# Patient Record
Sex: Male | Born: 1950 | Race: White | Hispanic: No | Marital: Married | Smoking: Former smoker
Health system: Southern US, Community
[De-identification: ages and names within clinical notes are randomized; demographics above are authoritative.]

## PROBLEM LIST (undated history)

## (undated) DIAGNOSIS — I2510 Atherosclerotic heart disease of native coronary artery without angina pectoris: Secondary | ICD-10-CM

## (undated) DIAGNOSIS — E785 Hyperlipidemia, unspecified: Secondary | ICD-10-CM

## (undated) DIAGNOSIS — E119 Type 2 diabetes mellitus without complications: Secondary | ICD-10-CM

## (undated) DIAGNOSIS — C61 Malignant neoplasm of prostate: Secondary | ICD-10-CM

## (undated) DIAGNOSIS — I10 Essential (primary) hypertension: Secondary | ICD-10-CM

## (undated) HISTORY — DX: Hyperlipidemia, unspecified: E78.5

## (undated) HISTORY — DX: Essential (primary) hypertension: I10

## (undated) HISTORY — DX: Atherosclerotic heart disease of native coronary artery without angina pectoris: I25.10

---

## 2010-04-13 ENCOUNTER — Other Ambulatory Visit: Payer: Self-pay | Admitting: Urology

## 2010-04-13 ENCOUNTER — Encounter (HOSPITAL_COMMUNITY): Payer: BC Managed Care – PPO

## 2010-04-13 ENCOUNTER — Ambulatory Visit (HOSPITAL_COMMUNITY)
Admission: RE | Admit: 2010-04-13 | Discharge: 2010-04-13 | Disposition: A | Discharge status: Home / Self Care | Payer: BC Managed Care – PPO | Source: Ambulatory Visit | Attending: Urology | Admitting: Urology

## 2010-04-13 DIAGNOSIS — Z01812 Encounter for preprocedural laboratory examination: Secondary | ICD-10-CM | POA: Insufficient documentation

## 2010-04-13 DIAGNOSIS — Z01818 Encounter for other preprocedural examination: Secondary | ICD-10-CM | POA: Insufficient documentation

## 2010-04-13 DIAGNOSIS — Z01810 Encounter for preprocedural cardiovascular examination: Secondary | ICD-10-CM | POA: Insufficient documentation

## 2010-04-13 LAB — SURGICAL PCR SCREEN
MRSA, PCR: NEGATIVE
Staphylococcus aureus: POSITIVE — AB

## 2010-04-13 LAB — BASIC METABOLIC PANEL
BUN: 13 mg/dL (ref 6–23)
Chloride: 103 mEq/L (ref 96–112)
GFR calc Af Amer: >60 mL/min (ref >60)
GFR calc non Af Amer: >60 mL/min (ref >60)
Potassium: 4.3 mEq/L (ref 3.5–5.1)
Sodium: 139 mEq/L (ref 135–145)

## 2010-04-13 LAB — CBC
Platelets: 144 10*3/uL — ABNORMAL LOW (ref 150–400)
RBC: 4.57 MIL/uL (ref 4.22–5.81)
RDW: 13.8 % (ref 11.5–15.5)
WBC: 5.6 10*3/uL (ref 4.0–10.5)

## 2010-04-20 ENCOUNTER — Other Ambulatory Visit: Payer: Self-pay | Admitting: Urology

## 2010-04-20 ENCOUNTER — Inpatient Hospital Stay (HOSPITAL_COMMUNITY)
Admission: RE | Admit: 2010-04-20 | Discharge: 2010-04-21 | DRG: 335 | Disposition: A | Discharge status: Home / Self Care | Payer: BC Managed Care – PPO | Source: Ambulatory Visit | Attending: Urology | Admitting: Urology

## 2010-04-20 DIAGNOSIS — E119 Type 2 diabetes mellitus without complications: Secondary | ICD-10-CM | POA: Diagnosis present

## 2010-04-20 DIAGNOSIS — I10 Essential (primary) hypertension: Secondary | ICD-10-CM | POA: Diagnosis present

## 2010-04-20 DIAGNOSIS — F17200 Nicotine dependence, unspecified, uncomplicated: Secondary | ICD-10-CM | POA: Diagnosis present

## 2010-04-20 DIAGNOSIS — C61 Malignant neoplasm of prostate: Principal | ICD-10-CM | POA: Diagnosis present

## 2010-04-20 LAB — GLUCOSE, CAPILLARY
Glucose-Capillary: 117 mg/dL — ABNORMAL HIGH (ref 70–99)
Glucose-Capillary: 184 mg/dL — ABNORMAL HIGH (ref 70–99)
Glucose-Capillary: 116 mg/dL — ABNORMAL HIGH (ref 70–99)
Glucose-Capillary: 97 mg/dL (ref 70–99)

## 2010-04-20 LAB — CBC
Hemoglobin: 14.4 g/dL (ref 13.0–17.0)
MCV: 94.7 fL (ref 78.0–100.0)
Platelets: 165 10*3/uL (ref 150–400)
RBC: 4.5 MIL/uL (ref 4.22–5.81)
WBC: 13.3 10*3/uL — ABNORMAL HIGH (ref 4.0–10.5)

## 2010-04-20 LAB — TYPE AND SCREEN
ABO/RH(D): A POS
Antibody Screen: NEGATIVE

## 2010-04-20 LAB — BASIC METABOLIC PANEL
BUN: 14 mg/dL (ref 6–23)
CO2: 26 mEq/L (ref 19–32)
Chloride: 104 mEq/L (ref 96–112)
Potassium: 4.3 mEq/L (ref 3.5–5.1)

## 2010-04-21 LAB — DIFFERENTIAL
Basophils Absolute: 0 10*3/uL (ref 0.0–0.1)
Lymphocytes Relative: 16 % (ref 12–46)
Lymphs Abs: 1.6 10*3/uL (ref 0.7–4.0)
Monocytes Absolute: 1 10*3/uL (ref 0.1–1.0)
Neutro Abs: 7.1 10*3/uL (ref 1.7–7.7)

## 2010-04-21 LAB — CBC
HCT: 39.3 % (ref 39.0–52.0)
Hemoglobin: 13 g/dL (ref 13.0–17.0)
MCV: 95.2 fL (ref 78.0–100.0)
WBC: 9.8 10*3/uL (ref 4.0–10.5)

## 2010-04-21 LAB — BASIC METABOLIC PANEL
CO2: 25 mEq/L (ref 19–32)
Glucose, Bld: 105 mg/dL — ABNORMAL HIGH (ref 70–99)
Potassium: 4 mEq/L (ref 3.5–5.1)
Sodium: 138 mEq/L (ref 135–145)

## 2010-04-29 NOTE — Op Note (Signed)
Eddie Carpenter, LAMBSON NO.:  [id number]  MEDICAL RECORD NO.:  [id number]           PATIENT TYPE:  I  LOCATION:  1424                         FACILITY:  [facility]  PHYSICIAN:  Valetta Fuller, M.D.  DATE OF BIRTH:  05-13-1950  DATE OF PROCEDURE:  04/20/2010 DATE OF DISCHARGE:  04/21/2010                              OPERATIVE REPORT   PREOPERATIVE DIAGNOSIS:  Clinical stage T1c adenocarcinoma of the prostate.  POSTOPERATIVE DIAGNOSIS:  Clinical stage T1c adenocarcinoma of the prostate.  PROCEDURE PERFORMED:  Robotic-assisted laparoscopic radical retropubic prostatectomy.  SURGEON:  Valetta Fuller, MD  FIRST ASSISTANT:  Delia Chimes, nurse practitioner.  ANESTHESIA:  General endotracheal.  INDICATIONS:  Mr. Chasen is 60 years of age.  The patient was noted to have a mildly elevated PSA of 4.1.  Ultrasound revealed approximately 50 g prostate which on biopsy showed adenocarcinoma of the prostate with aGleason score of 3 +4 equals 7, involving one of the 12 cores.  The patient underwent extensive discussion about treatment options.  He elected to proceed with radical retropubic prostatectomy and appeared to understand the advantages and disadvantages of that approach.  The patient now presents for the procedure.  He has performed a mechanical bowel prep.  He has had placement of compression boots for DVT prophylaxis and has received perioperative antibiotics.  TECHNIQUE AND FINDINGS:  The patient was brought to the operating room where he had successful induction of general endotracheal anesthesia. He was placed in a low lithotomy position with all extremities carefully padded and was secured to the operative table.  He was prepped and draped in usual manner and then placed in a straight Trendelenburg position.  Foley catheter was inserted sterilely on the field.  Initial camera port incision was chosen 18 cm above the pubic symphysis just above the  umbilicus.  A standard open Hasson technique was utilized and placement of the initial 12-mm trocar and abdominal insufflation occurred without incident.  All other trocars were placed with direct visual guidance including a 12-mm and 5-mm assistant port along with 3 mm robotic trocars.  The surgical cart was then docked.  The bladder was filled and the space of Retzius developed with electrocautery scissors and blunt dissection.  Superficial fat was taken off the endopelvic fascia and bladder neck.  The endopelvic fascia was then incised widely from base to apex.  Levator musculature was swept off the apex of the prostate isolating the dorsal venous complex which was stapled with an ETS stapling device with good hemostasis.  Anterior bladder neck was identified with the aid of the Foley balloon. This was then transected with electrocautery scissors.  Indigo carmine was given and we were well away from the ureteral orifices.  The Foley catheter was retracted anteriorly.  There was no evidence of a middle lobe.  The posterior bladder neck was transected and the dissection carried down to the vas deferens and seminal vesicles which were individually dissected free.  With the anterior traction, the posterior plane between the rectum and prostate was then established.  The patient was felt to be a candidate for bilateral nerve spare. Superficial  anterior fascia off the prostate was incised and the dissection then carried posterior laterally until groove established between the neurovascular bundle and the posterior lateral capsule of the prostate.  That was then extended from apex to base of the prostate. Lateral pedicles of the prostate were taken with an EnSeal  device. The anterior urethra was identified with a Foley catheter and transected in the midline.  The Foley catheter was then removed and the posterior urethra transected.  Rectourethralis tissue was then sharply incised and the  prostate specimen was removed.  The pelvis was irrigated and rectal insufflation revealed no evidence of rectal injury.  We did not feel that his pathology required bilateral lymph node dissection.  Attention was then turned towards reconstruction.  Again the ureteral orifices were identified.  The posterior bladder neck and posterior urethra reapproximated with a 2-0 Vicryl suture.  The anastomosis was then completed with a double-armed 3-0 Monocryl suture and a new catheter was placed.  Irrigation revealed no evidence of leakage and clear return of blue-tinged urine/irrigant.  A pelvic drain was placed through one of the robotic trocars, secured to the skin, and placed overlying the anastomosis.  The prostate specimen was placed in an Endopouch retrieval bag system.  The 12-mm assistant port site was closed with a Vicryl suture with the aid of a suture passer.  The camera port incision was extended slightly to allow for removal of the specimen and that fascia was then closed with a running Vicryl suture.  All trocars were taken out with direct visual guidance.  All trocar sites were infiltrated with Marcaine and closed with clips.  Estimated blood loss was approximately 100 cc.  The patient had no obvious complications and was brought to recovery room in stable condition.     Valetta Fuller, M.D.     DSG/MEDQ  D:  04/21/2010  T:  04/21/2010  Job:  [id number]  Electronically Signed by Barron Alvine M.D. on 04/29/2010 01:47:33 PM

## 2012-04-14 IMAGING — CR DG CHEST 2V
2 series · 2 of 2 positions shown · non-contrast
Comparison: None

CLINICAL DATA: Preop prostate surgery

CHEST - 2 VIEW

[w chest pa]
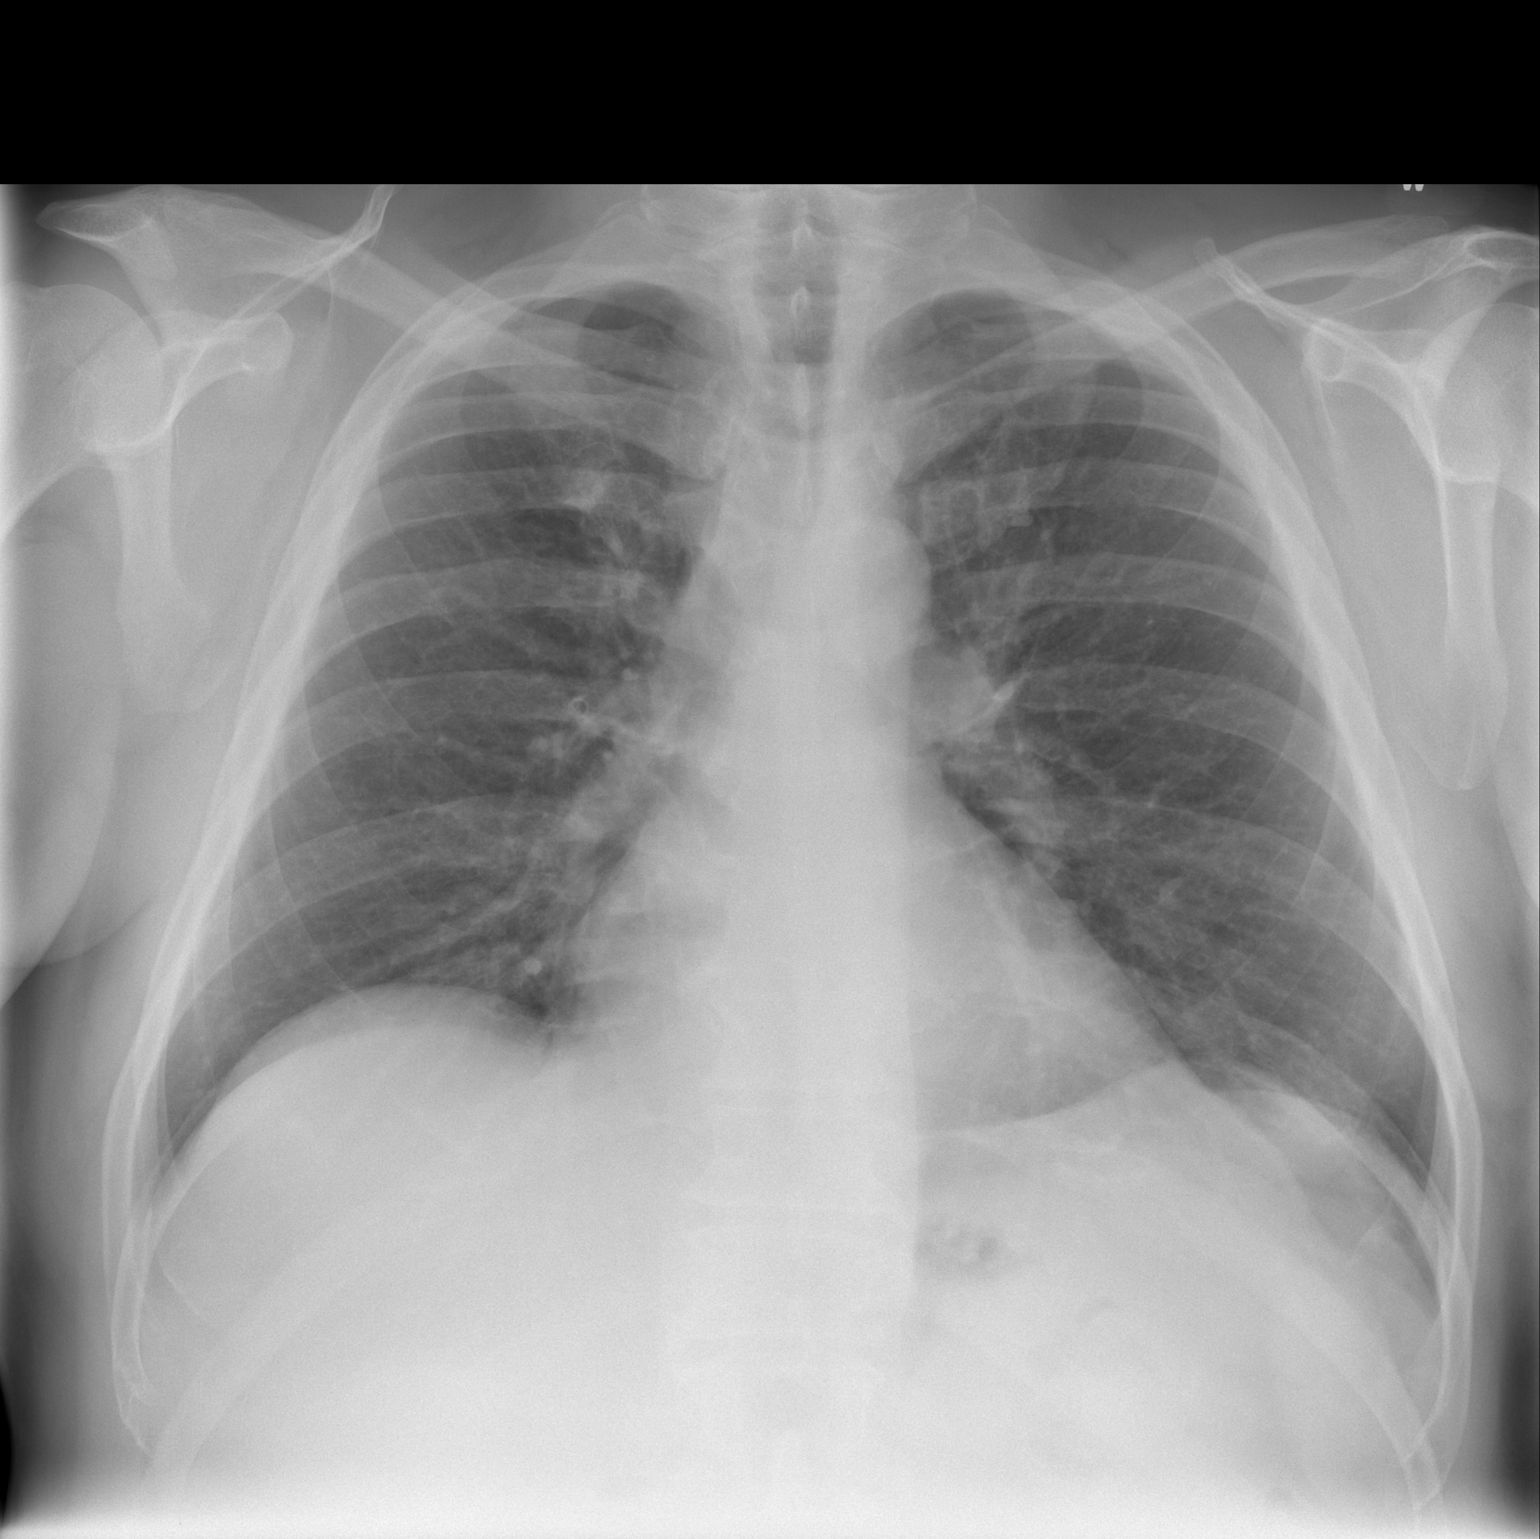

[w chest lat]
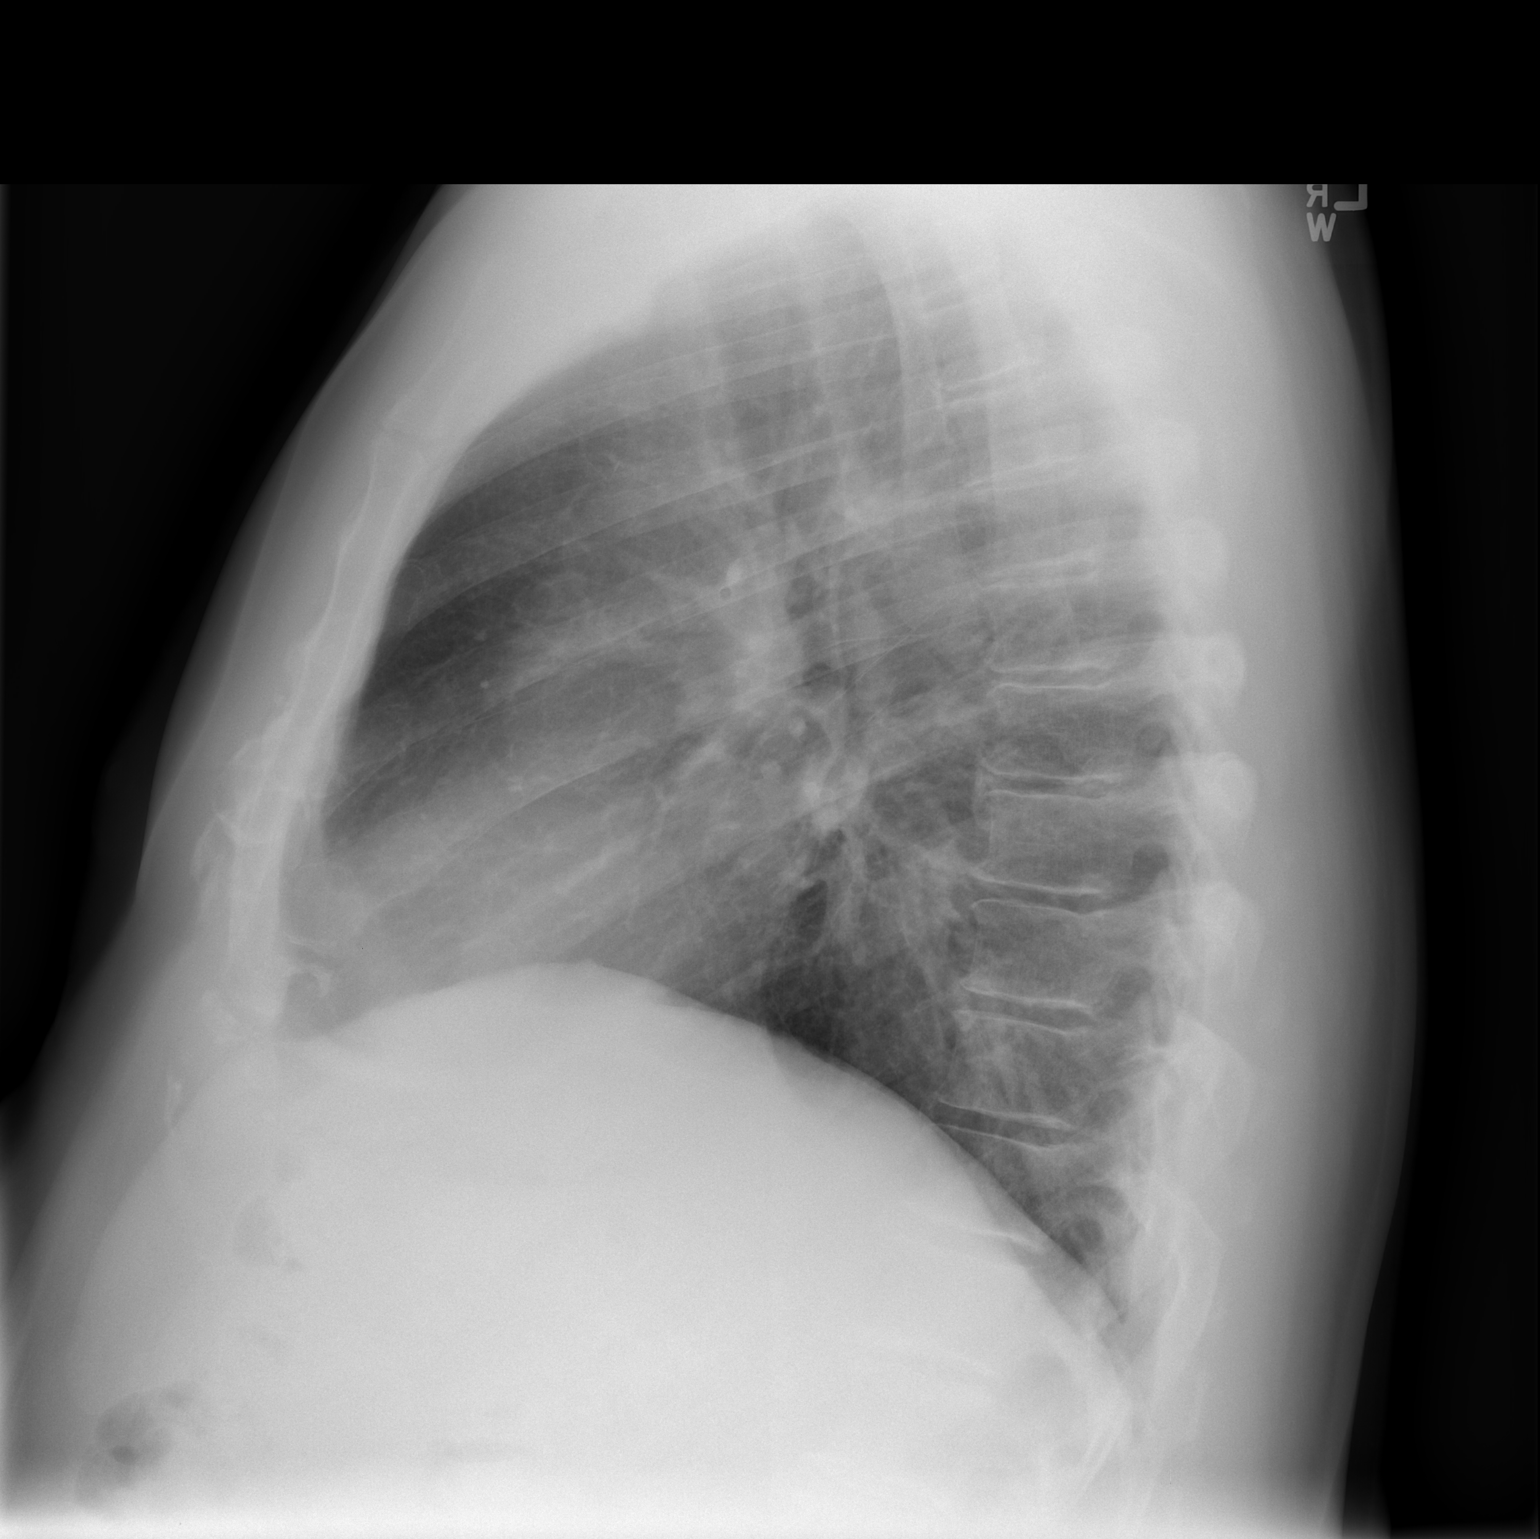

[2 of 2 positions shown; findings below may reference images not displayed]

FINDINGS: Heart size is normal.  Negative for heart failure.  The
lungs are clear without infiltrate or effusion.  Negative for mass
lesion.
IMPRESSION: No active cardiopulmonary disease.

## 2018-08-18 ENCOUNTER — Emergency Department (HOSPITAL_COMMUNITY): Payer: BC Managed Care – PPO | Admitting: Certified Registered Nurse Anesthetist

## 2018-08-18 ENCOUNTER — Encounter (HOSPITAL_COMMUNITY): Payer: Self-pay | Admitting: Emergency Medicine

## 2018-08-18 ENCOUNTER — Ambulatory Visit (HOSPITAL_COMMUNITY)
Admission: EM | Admit: 2018-08-18 | Discharge: 2018-08-18 | Disposition: A | Discharge status: Home / Self Care | Payer: BC Managed Care – PPO | Attending: Emergency Medicine | Admitting: Emergency Medicine

## 2018-08-18 ENCOUNTER — Encounter (HOSPITAL_COMMUNITY)
Admission: EM | Disposition: A | Discharge status: Home / Self Care | Payer: Self-pay | Source: Home / Self Care | Attending: Emergency Medicine

## 2018-08-18 ENCOUNTER — Other Ambulatory Visit: Payer: Self-pay

## 2018-08-18 ENCOUNTER — Emergency Department (HOSPITAL_COMMUNITY): Payer: BC Managed Care – PPO

## 2018-08-18 DIAGNOSIS — S51851A Open bite of right forearm, initial encounter: Secondary | ICD-10-CM

## 2018-08-18 DIAGNOSIS — Z87891 Personal history of nicotine dependence: Secondary | ICD-10-CM | POA: Diagnosis not present

## 2018-08-18 DIAGNOSIS — Z79899 Other long term (current) drug therapy: Secondary | ICD-10-CM | POA: Insufficient documentation

## 2018-08-18 DIAGNOSIS — W540XXA Bitten by dog, initial encounter: Secondary | ICD-10-CM

## 2018-08-18 DIAGNOSIS — Z7984 Long term (current) use of oral hypoglycemic drugs: Secondary | ICD-10-CM | POA: Insufficient documentation

## 2018-08-18 DIAGNOSIS — S41151A Open bite of right upper arm, initial encounter: Secondary | ICD-10-CM | POA: Diagnosis present

## 2018-08-18 DIAGNOSIS — Z8546 Personal history of malignant neoplasm of prostate: Secondary | ICD-10-CM | POA: Insufficient documentation

## 2018-08-18 DIAGNOSIS — S61451A Open bite of right hand, initial encounter: Secondary | ICD-10-CM | POA: Insufficient documentation

## 2018-08-18 DIAGNOSIS — Z1159 Encounter for screening for other viral diseases: Secondary | ICD-10-CM | POA: Insufficient documentation

## 2018-08-18 DIAGNOSIS — E119 Type 2 diabetes mellitus without complications: Secondary | ICD-10-CM | POA: Diagnosis not present

## 2018-08-18 HISTORY — DX: Malignant neoplasm of prostate: C61

## 2018-08-18 HISTORY — DX: Type 2 diabetes mellitus without complications: E11.9

## 2018-08-18 HISTORY — DX: Bitten by dog, initial encounter: W54.0XXA

## 2018-08-18 HISTORY — DX: Open bite of right upper arm, initial encounter: S41.151A

## 2018-08-18 HISTORY — PX: I & D EXTREMITY: SHX5045

## 2018-08-18 LAB — BASIC METABOLIC PANEL
Anion gap: 7 (ref 5–15)
BUN: 19 mg/dL (ref 8–23)
CO2: 24 mmol/L (ref 22–32)
Calcium: 9.5 mg/dL (ref 8.9–10.3)
Chloride: 106 mmol/L (ref 98–111)
Creatinine, Ser: 1.03 mg/dL (ref 0.61–1.24)
GFR calc Af Amer: >60 mL/min (ref >60)
GFR calc non Af Amer: >60 mL/min (ref >60)
Glucose, Bld: 302 mg/dL — ABNORMAL HIGH (ref 70–99)
Potassium: 4.1 mmol/L (ref 3.5–5.1)
Sodium: 137 mmol/L (ref 135–145)

## 2018-08-18 LAB — CBC WITH DIFFERENTIAL/PLATELET
Abs Immature Granulocytes: 0.04 10*3/uL (ref 0.00–0.07)
Basophils Absolute: 0 10*3/uL (ref 0.0–0.1)
Basophils Relative: 0 %
Eosinophils Absolute: 0.1 10*3/uL (ref 0.0–0.5)
Eosinophils Relative: 1 %
HCT: 42.2 % (ref 39.0–52.0)
Hemoglobin: 14.2 g/dL (ref 13.0–17.0)
Immature Granulocytes: 1 %
Lymphocytes Relative: 11 %
Lymphs Abs: 0.8 10*3/uL (ref 0.7–4.0)
MCH: 31.1 pg (ref 26.0–34.0)
MCHC: 33.6 g/dL (ref 30.0–36.0)
MCV: 92.3 fL (ref 80.0–100.0)
Monocytes Absolute: 0.5 10*3/uL (ref 0.1–1.0)
Monocytes Relative: 7 %
Neutro Abs: 5.9 10*3/uL (ref 1.7–7.7)
Neutrophils Relative %: 80 %
Platelets: 157 10*3/uL (ref 150–400)
RBC: 4.57 MIL/uL (ref 4.22–5.81)
RDW: 13.8 % (ref 11.5–15.5)
WBC: 7.3 10*3/uL (ref 4.0–10.5)
nRBC: 0 % (ref 0.0–0.2)

## 2018-08-18 LAB — SARS CORONAVIRUS 2 BY RT PCR (HOSPITAL ORDER, PERFORMED IN ~~LOC~~ HOSPITAL LAB): SARS Coronavirus 2: NEGATIVE

## 2018-08-18 SURGERY — IRRIGATION AND DEBRIDEMENT EXTREMITY
Anesthesia: General | Site: Arm Lower | Laterality: Right

## 2018-08-18 MED ORDER — ONDANSETRON HCL 4 MG/2ML IJ SOLN
INTRAMUSCULAR | Status: AC
  Filled 2018-08-18: qty 2

## 2018-08-18 MED ORDER — HYDROCODONE-ACETAMINOPHEN 5-325 MG PO TABS: 1.0000 | ORAL_TABLET | Freq: Four times a day (QID) | ORAL | 0 refills | Status: AC | PRN

## 2018-08-18 MED ORDER — TETANUS-DIPHTH-ACELL PERTUSSIS 5-2.5-18.5 LF-MCG/0.5 IM SUSP
0.5000 mL | Freq: Once | INTRAMUSCULAR | Status: AC
  Administered 2018-08-18: 14:00:00 0.5 mL via INTRAMUSCULAR
  Filled 2018-08-18: qty 0.5

## 2018-08-18 MED ORDER — LIDOCAINE 2% (20 MG/ML) 5 ML SYRINGE
INTRAMUSCULAR | Status: DC | PRN
  Administered 2018-08-18: 100 mg via INTRAVENOUS

## 2018-08-18 MED ORDER — PROPOFOL 10 MG/ML IV BOLUS
INTRAVENOUS | Status: AC
  Filled 2018-08-18: qty 20

## 2018-08-18 MED ORDER — FENTANYL CITRATE (PF) 250 MCG/5ML IJ SOLN
INTRAMUSCULAR | Status: AC
  Filled 2018-08-18: qty 5

## 2018-08-18 MED ORDER — BUPIVACAINE HCL 0.25 % IJ SOLN
INTRAMUSCULAR | Status: DC | PRN
  Administered 2018-08-18: 10 mL

## 2018-08-18 MED ORDER — ONDANSETRON HCL 4 MG PO TABS: 4.0000 mg | ORAL_TABLET | Freq: Three times a day (TID) | ORAL | 0 refills | Status: DC | PRN

## 2018-08-18 MED ORDER — LACTATED RINGERS IV SOLN
INTRAVENOUS | Status: DC | PRN
  Administered 2018-08-18: 17:00:00 via INTRAVENOUS

## 2018-08-18 MED ORDER — MIDAZOLAM HCL 2 MG/2ML IJ SOLN
INTRAMUSCULAR | Status: DC | PRN
  Administered 2018-08-18: 2 mg via INTRAVENOUS

## 2018-08-18 MED ORDER — CEFAZOLIN SODIUM-DEXTROSE 2-3 GM-%(50ML) IV SOLR
INTRAVENOUS | Status: DC | PRN
  Administered 2018-08-18: 2 g via INTRAVENOUS

## 2018-08-18 MED ORDER — PHENYLEPHRINE 40 MCG/ML (10ML) SYRINGE FOR IV PUSH (FOR BLOOD PRESSURE SUPPORT)
PREFILLED_SYRINGE | INTRAVENOUS | Status: AC
  Filled 2018-08-18: qty 10

## 2018-08-18 MED ORDER — CLINDAMYCIN HCL 300 MG PO CAPS: 300.0000 mg | ORAL_CAPSULE | Freq: Three times a day (TID) | ORAL | 0 refills | Status: AC

## 2018-08-18 MED ORDER — FENTANYL CITRATE (PF) 250 MCG/5ML IJ SOLN
INTRAMUSCULAR | Status: DC | PRN
  Administered 2018-08-18: 25 ug via INTRAVENOUS
  Administered 2018-08-18: 50 ug via INTRAVENOUS

## 2018-08-18 MED ORDER — OXYCODONE-ACETAMINOPHEN 5-325 MG PO TABS
1.0000 | ORAL_TABLET | Freq: Once | ORAL | Status: AC
  Administered 2018-08-18: 1 via ORAL
  Filled 2018-08-18: qty 1

## 2018-08-18 MED ORDER — DEXAMETHASONE SODIUM PHOSPHATE 10 MG/ML IJ SOLN
INTRAMUSCULAR | Status: DC | PRN
  Administered 2018-08-18: 5 mg via INTRAVENOUS

## 2018-08-18 MED ORDER — CLINDAMYCIN PHOSPHATE 600 MG/50ML IV SOLN
600.0000 mg | Freq: Once | INTRAVENOUS | Status: AC
  Administered 2018-08-18: 600 mg via INTRAVENOUS
  Filled 2018-08-18: qty 50

## 2018-08-18 MED ORDER — DOXYCYCLINE HYCLATE 50 MG PO CAPS: 100.0000 mg | ORAL_CAPSULE | Freq: Two times a day (BID) | ORAL | 0 refills | Status: AC

## 2018-08-18 MED ORDER — BUPIVACAINE HCL (PF) 0.25 % IJ SOLN
INTRAMUSCULAR | Status: AC
  Filled 2018-08-18: qty 30

## 2018-08-18 MED ORDER — ONDANSETRON HCL 4 MG/2ML IJ SOLN
INTRAMUSCULAR | Status: DC | PRN
  Administered 2018-08-18: 4 mg via INTRAVENOUS

## 2018-08-18 MED ORDER — SODIUM CHLORIDE 0.9 % IR SOLN
Status: DC | PRN
  Administered 2018-08-18: 3000 mL

## 2018-08-18 MED ORDER — CEFAZOLIN SODIUM-DEXTROSE 2-4 GM/100ML-% IV SOLN
INTRAVENOUS | Status: AC
  Filled 2018-08-18: qty 100

## 2018-08-18 MED ORDER — SUCCINYLCHOLINE CHLORIDE 20 MG/ML IJ SOLN
INTRAMUSCULAR | Status: DC | PRN
  Administered 2018-08-18: 160 mg via INTRAVENOUS

## 2018-08-18 MED ORDER — MIDAZOLAM HCL 2 MG/2ML IJ SOLN
INTRAMUSCULAR | Status: AC
  Filled 2018-08-18: qty 2

## 2018-08-18 MED ORDER — 0.9 % SODIUM CHLORIDE (POUR BTL) OPTIME
TOPICAL | Status: DC | PRN
  Administered 2018-08-18: 1000 mL

## 2018-08-18 MED ORDER — PROPOFOL 10 MG/ML IV BOLUS
INTRAVENOUS | Status: DC | PRN
  Administered 2018-08-18: 160 mg via INTRAVENOUS

## 2018-08-18 SURGICAL SUPPLY — 58 items
BANDAGE ACE 4X5 VEL STRL LF (GAUZE/BANDAGES/DRESSINGS) ×4 IMPLANT
BANDAGE ACE 6X5 VEL STRL LF (GAUZE/BANDAGES/DRESSINGS) ×2 IMPLANT
BANDAGE ELASTIC 4 VELCRO ST LF (GAUZE/BANDAGES/DRESSINGS) ×4 IMPLANT
BANDAGE ESMARK 6X9 LF (GAUZE/BANDAGES/DRESSINGS) IMPLANT
BLADE SURG 10 STRL SS (BLADE) ×2 IMPLANT
BNDG COHESIVE 4X5 TAN STRL (GAUZE/BANDAGES/DRESSINGS) ×2 IMPLANT
BNDG ELASTIC 6X10 VLCR STRL LF (GAUZE/BANDAGES/DRESSINGS) ×2 IMPLANT
BNDG ESMARK 4X9 LF (GAUZE/BANDAGES/DRESSINGS) IMPLANT
BNDG ESMARK 6X9 LF (GAUZE/BANDAGES/DRESSINGS)
BNDG GAUZE ELAST 4 BULKY (GAUZE/BANDAGES/DRESSINGS) ×4 IMPLANT
CONT SPEC 4OZ CLIKSEAL STRL BL (MISCELLANEOUS) IMPLANT
COVER SURGICAL LIGHT HANDLE (MISCELLANEOUS) ×2 IMPLANT
COVER WAND RF STERILE (DRAPES) ×2 IMPLANT
CUFF TOURN SGL LL 12 NO SLV (MISCELLANEOUS) ×2 IMPLANT
CUFF TOURN SGL QUICK 34 (TOURNIQUET CUFF) ×1
CUFF TRNQT CYL 34X4.125X (TOURNIQUET CUFF) ×1 IMPLANT
DRAPE SURG 17X23 STRL (DRAPES) IMPLANT
DRAPE U-SHAPE 47X51 STRL (DRAPES) ×2 IMPLANT
DRESSING ADAPTIC 1/2  N-ADH (PACKING) ×6 IMPLANT
DRSG PAD ABDOMINAL 8X10 ST (GAUZE/BANDAGES/DRESSINGS) ×2 IMPLANT
DURAPREP 26ML APPLICATOR (WOUND CARE) ×2 IMPLANT
ELECT REM PT RETURN 9FT ADLT (ELECTROSURGICAL)
ELECTRODE REM PT RTRN 9FT ADLT (ELECTROSURGICAL) IMPLANT
EVACUATOR 1/8 PVC DRAIN (DRAIN) IMPLANT
FACESHIELD WRAPAROUND (MASK) ×2 IMPLANT
GAUZE SPONGE 4X4 12PLY STRL (GAUZE/BANDAGES/DRESSINGS) ×2 IMPLANT
GAUZE SPONGE 4X4 12PLY STRL LF (GAUZE/BANDAGES/DRESSINGS) ×4 IMPLANT
GAUZE XEROFORM 1X8 LF (GAUZE/BANDAGES/DRESSINGS) IMPLANT
GLOVE BIO SURGEON STRL SZ7.5 (GLOVE) ×6 IMPLANT
GLOVE BIOGEL PI IND STRL 8 (GLOVE) ×3 IMPLANT
GLOVE BIOGEL PI INDICATOR 8 (GLOVE) ×3
GOWN STRL REUS W/ TWL LRG LVL3 (GOWN DISPOSABLE) ×3 IMPLANT
GOWN STRL REUS W/TWL LRG LVL3 (GOWN DISPOSABLE) ×3
HANDPIECE INTERPULSE COAX TIP (DISPOSABLE)
KIT BASIN OR (CUSTOM PROCEDURE TRAY) ×2 IMPLANT
KIT TURNOVER KIT B (KITS) ×2 IMPLANT
MANIFOLD NEPTUNE II (INSTRUMENTS) ×2 IMPLANT
NEEDLE 25GAX1.5 (MISCELLANEOUS) IMPLANT
NS IRRIG 1000ML POUR BTL (IV SOLUTION) ×2 IMPLANT
PACK ORTHO EXTREMITY (CUSTOM PROCEDURE TRAY) ×2 IMPLANT
PAD ARMBOARD 7.5X6 YLW CONV (MISCELLANEOUS) ×4 IMPLANT
SET CYSTO W/LG BORE CLAMP LF (SET/KITS/TRAYS/PACK) ×2 IMPLANT
SET HNDPC FAN SPRY TIP SCT (DISPOSABLE) IMPLANT
SLING ARM FOAM STRAP XLG (SOFTGOODS) ×2 IMPLANT
SPONGE LAP 18X18 RF (DISPOSABLE) ×2 IMPLANT
STOCKINETTE IMPERVIOUS 9X36 MD (GAUZE/BANDAGES/DRESSINGS) ×2 IMPLANT
SUT ETHILON 3 0 PS 1 (SUTURE) ×8 IMPLANT
SUT PDS AB 2-0 CT1 27 (SUTURE) IMPLANT
SUT VIC AB 0 CT1 27 (SUTURE) ×1
SUT VIC AB 0 CT1 27XBRD ANBCTR (SUTURE) ×1 IMPLANT
SWAB CULTURE ESWAB REG 1ML (MISCELLANEOUS) IMPLANT
SYR CONTROL 10ML LL (SYRINGE) IMPLANT
TOWEL GREEN STERILE FF (TOWEL DISPOSABLE) ×4 IMPLANT
TOWEL OR 17X26 10 PK STRL BLUE (TOWEL DISPOSABLE) ×2 IMPLANT
TUBE CONNECTING 12X1/4 (SUCTIONS) ×2 IMPLANT
TUBING CYSTO DISP (UROLOGICAL SUPPLIES) IMPLANT
UNDERPAD 30X30 (UNDERPADS AND DIAPERS) ×2 IMPLANT
YANKAUER SUCT BULB TIP NO VENT (SUCTIONS) ×2 IMPLANT

## 2018-08-18 NOTE — ED Notes (Signed)
Valuables locked with security.

## 2018-08-18 NOTE — Anesthesia Postprocedure Evaluation (Signed)
Anesthesia Post Note  Patient: Eddie Carpenter  Procedure(s) Performed: IRRIGATION AND DEBRIDEMENT RIGHT ARM AND REPAIR OF LACERATION (Right Arm Lower)     Patient location during evaluation: PACU Anesthesia Type: General Level of consciousness: sedated and patient cooperative Pain management: pain level controlled Vital Signs Assessment: post-procedure vital signs reviewed and stable Respiratory status: spontaneous breathing Cardiovascular status: stable Anesthetic complications: no    Last Vitals:  Vitals:   08/18/18 1840 08/18/18 1855  BP: (!) 161/82 (!) 159/78  Pulse: 81 79  Resp: 11 11  Temp:    SpO2: 93% 91%    Last Pain:  Vitals:   08/18/18 1855  TempSrc:   PainSc: 0-No pain                 Nolon Nations

## 2018-08-18 NOTE — Transfer of Care (Signed)
Immediate Anesthesia Transfer of Care Note  Patient: Eddie Carpenter  Procedure(s) Performed: IRRIGATION AND DEBRIDEMENT RIGHT ARM AND REPAIR OF LACERATION (Right Arm Lower)  Patient Location: PACU  Anesthesia Type:General  Level of Consciousness: awake, alert  and oriented  Airway & Oxygen Therapy: Patient Spontanous Breathing and Patient connected to nasal cannula oxygen  Post-op Assessment: Report given to RN and Post -op Vital signs reviewed and stable  Post vital signs: Reviewed and stable  Last Vitals:  Vitals Value Taken Time  BP    Temp    Pulse    Resp    SpO2      Last Pain:  Vitals:   08/18/18 1518  TempSrc:   PainSc: 1          Complications: No apparent anesthesia complications

## 2018-08-18 NOTE — Interval H&P Note (Signed)
I participated in the care of this patient and agree with the above history, physical and evaluation. I performed a review of the history and a physical exam as detailed   Jolleen Seman Daniel Tamyka Bezio MD  

## 2018-08-18 NOTE — ED Provider Notes (Signed)
[facility] EMERGENCY DEPARTMENT Provider Note   CSN: [id number] Arrival date & time: 08/18/18  1324    History   Chief Complaint Chief Complaint  Patient presents with  . Animal Bite    HPI Eddie Carpenter is a 68 y.o. male with past medical history of type 2 diabetes, prostate cancer, presenting to the emergency department with sudden onset of right forearm pain after a dog bite that occurred prior to arrival.  Patient states he was attempting to talk to his neighbor who was sitting outside, and her [place] attacked him suddenly, biting his right forearm.  He has a large wound to the forearm, as well as more superficial wounds to his right hand.  He states the dog is up-to-date on rabies immunizations.  He has associated pain with movement.  No medications taken prior to arrival for pain.  Patient is not on anticoagulation.  No other injuries reported.     The history is provided by the patient.    Past Medical History:  Diagnosis Date  . Diabetes mellitus without complication ([place])   . Prostate cancer [facility])     Patient Active Problem List   Diagnosis Date Noted  . Dog bite of right upper extremity 08/18/2018    History reviewed. No pertinent surgical history.      Home Medications    Prior to Admission medications   Medication Sig Start Date End Date Taking? Authorizing Provider  JANUVIA 100 MG tablet Take 100 mg by mouth at bedtime.  07/12/18  Yes [provider]  lisinopril (ZESTRIL) 5 MG tablet Take 5 mg by mouth at bedtime.  05/26/18  Yes [provider]  metFORMIN (GLUCOPHAGE-XR) 500 MG 24 hr tablet Take 2,000 mg by mouth at bedtime. 05/26/18  Yes [provider]  pioglitazone (ACTOS) 15 MG tablet Take 1 tablet by mouth at bedtime.  05/26/18  Yes [provider]  simvastatin (ZOCOR) 40 MG tablet Take 40 mg by mouth at bedtime.  05/26/18  Yes [provider]    Family History No family history on  file.  Social History Social History   Tobacco Use  . Smoking status: Never Smoker  Substance Use Topics  . Alcohol use: Never    Frequency: Never  . Drug use: Never     Allergies   Penicillins   Review of Systems Review of Systems  Musculoskeletal: Positive for myalgias.  Skin: Positive for wound.  All other systems reviewed and are negative.    Physical Exam Updated Vital Signs BP (!) 163/82   Pulse 82   Temp 98.4 F (36.9 C) (Oral)   Resp 18   Wt 106.6 kg   SpO2 95%   Physical Exam Vitals signs and nursing note reviewed.  Constitutional:      Appearance: He is well-developed.  HENT:     Head: Normocephalic and atraumatic.  Eyes:     Conjunctiva/sclera: Conjunctivae normal.  Cardiovascular:     Rate and Rhythm: Normal rate and regular rhythm.  Pulmonary:     Effort: Pulmonary effort is normal. No respiratory distress.     Breath sounds: Normal breath sounds.  Abdominal:     Palpations: Abdomen is soft.  Musculoskeletal:     Comments: There is a very large gaping wound to the right dorsal forearm which appears to go through the fascia and into the muscle of the forearm.  There is also a linear laceration to the dorsum of the hand that  nearly spans the width of the hand.  There are multiple puncture wounds to the palmar aspect of the hand.  No obvious foreign bodies visualized.  Brisk capillary refill and normal sensation to all digits.  Skin:    General: Skin is warm.  Neurological:     Mental Status: He is alert.  Psychiatric:        Behavior: Behavior normal.            ED Treatments / Results  Labs (all labs ordered are listed, but only abnormal results are displayed) Labs Reviewed  BASIC METABOLIC PANEL - Abnormal; Notable for the following components:      Result Value   Glucose, Bld 302 (*)    All other components within normal limits  SARS CORONAVIRUS 2 (HOSPITAL ORDER, [facility])  CBC WITH  DIFFERENTIAL/PLATELET    EKG None  Radiology Dg Forearm Right  Result Date: 08/18/2018 CLINICAL DATA:  Dog bites of the right hand and right forearm. EXAM: RIGHT FOREARM - 2 VIEW COMPARISON:  None. FINDINGS: There is no fracture or dislocation or radiodensity in the soft tissues. Large soft tissue laceration to the lateral aspect of the distal forearm. IMPRESSION: Soft tissue defect.  Bones are normal. Electronically Signed   By: Lorriane Shire M.D.   On: 08/18/2018 14:43   Dg Hand Complete Right  Result Date: 08/18/2018 CLINICAL DATA:  Dog bite to the right hand. EXAM: RIGHT HAND - COMPLETE 3+ VIEW COMPARISON:  None. FINDINGS: There is a soft tissue wound to the ulnar aspect of the hand and wrist. No fracture or other acute bone abnormality. Slight osteoarthritic changes of the IP joints. IMPRESSION: Soft tissue wound.  No acute bone abnormality. Electronically Signed   By: Lorriane Shire M.D.   On: 08/18/2018 14:44    Procedures Procedures (including critical care time)  Medications Ordered in ED Medications  ceFAZolin (ANCEF) 2-4 GM/100ML-% IVPB (has no administration in time range)  0.9 % irrigation (POUR BTL) (1,000 mLs Irrigation Given 08/18/18 1701)  sodium chloride irrigation 0.9 % (3,000 mLs Irrigation Given 08/18/18 1720)  bupivacaine (MARCAINE) 0.25 % (with pres) injection (10 mLs Infiltration Given 08/18/18 1747)  Tdap (BOOSTRIX) injection 0.5 mL (0.5 mLs Intramuscular Given 08/18/18 1409)  oxyCODONE-acetaminophen (PERCOCET/ROXICET) 5-325 MG per tablet 1 tablet (1 tablet Oral Given 08/18/18 1409)  clindamycin (CLEOCIN) IVPB 600 mg (0 mg Intravenous Stopped 08/18/18 1622)  phenylephrine 0.4-0.9 MG/10ML-% injection (has no administration in time range)  ondansetron (ZOFRAN) 4 MG/2ML injection (has no administration in time range)  propofol (DIPRIVAN) 10 mg/mL bolus/IV push (has no administration in time range)  fentaNYL (SUBLIMAZE) 250 MCG/5ML injection (has no administration in  time range)  midazolam (VERSED) 2 MG/2ML injection (has no administration in time range)  bupivacaine (PF) (MARCAINE) 0.25 % injection (has no administration in time range)     Initial Impression / Assessment and Plan / ED Course  I have reviewed the triage vital signs and the nursing notes.  Pertinent labs & imaging results that were available during my care of the patient were reviewed by me and considered in my medical decision making (see chart for details).  Clinical Course as of Aug 17 1804  Sun Aug 18, 2018  1442 Dr. Percell Miller with hand to evaluate patient and likely bring to OR for wash out and repair   [JR]  1610 Patient reevaluated.  Reports improvement in pain.  X-rays are negative.  Discussed plan for hand specialist evaluation  and possible OR for washout and repair.  Patient verbalized understanding and agrees with care plan at this time.   [JR]    Clinical Course User Index [JR] Robinson, [place] N, PA-C       Patient presenting after dog bite to the right forearm that appears to go through the skin and fascia into the muscle.  Neurovascularly intact.  Patient given tetanus, pain medication, and clindamycin.  X-rays are negative for fracture or retained foreign body.  Due to the extensity of the wound, we felt this would be better irrigated and repaired in the OR.  Dr. Percell Miller with hand was consulted, and brought patient to the OR for washout and repair.  Appreciate consult.  Final Clinical Impressions(s) / ED Diagnoses   Final diagnoses:  Open wound of right forearm due to dog bite    ED Discharge Orders    None       Robinson, [place] N, PA-C 08/18/18 1806    Noemi Chapel, MD 08/20/18 [phone]

## 2018-08-18 NOTE — Anesthesia Procedure Notes (Signed)
Procedure Name: Intubation Date/Time: 08/18/2018 5:16 PM Performed by: Clearnce Sorrel, CRNA Pre-anesthesia Checklist: Patient identified, Emergency Drugs available, Suction available, Patient being monitored and Timeout performed Patient Re-evaluated:Patient Re-evaluated prior to induction Oxygen Delivery Method: Circle system utilized Preoxygenation: Pre-oxygenation with 100% oxygen Induction Type: Rapid sequence and Cricoid Pressure applied Laryngoscope Size: Mac and 4 Grade View: Grade II Tube type: Oral Tube size: 7.5 mm Number of attempts: 1 Airway Equipment and Method: Stylet Placement Confirmation: ETT inserted through vocal cords under direct vision,  positive ETCO2 and breath sounds checked- equal and bilateral Secured at: 23 cm Tube secured with: Tape Dental Injury: Teeth and Oropharynx as per pre-operative assessment

## 2018-08-18 NOTE — ED Triage Notes (Signed)
Patient was bitten on R forearm by neighbor's dog about 30 minutes ago. Muscle and bone visible. Bleeding controlled with pressure.

## 2018-08-18 NOTE — Anesthesia Preprocedure Evaluation (Addendum)
Anesthesia Evaluation  Patient identified by MRN, date of birth, ID band Patient awake    Reviewed: Allergy & Precautions, NPO status , Patient's Chart, lab work & pertinent test results  Airway Mallampati: II  TM Distance: >3 FB Neck ROM: Full    Dental  (+) Dental Advisory Given   Pulmonary neg pulmonary ROS,    Pulmonary exam normal breath sounds clear to auscultation       Cardiovascular negative cardio ROS Normal cardiovascular exam Rhythm:Regular Rate:Normal     Neuro/Psych negative neurological ROS  negative psych ROS   GI/Hepatic negative GI ROS, Neg liver ROS,   Endo/Other  negative endocrine ROSdiabetes  Renal/GU negative Renal ROS     Musculoskeletal negative musculoskeletal ROS (+)   Abdominal   Peds  Hematology negative hematology ROS (+)   Anesthesia Other Findings   Reproductive/Obstetrics                             Anesthesia Physical Anesthesia Plan  ASA: II and emergent  Anesthesia Plan: General   Post-op Pain Management:    Induction: Intravenous, Rapid sequence and Cricoid pressure planned  PONV Risk Score and Plan: 3 and Ondansetron, Dexamethasone and Treatment may vary due to age or medical condition  Airway Management Planned: Oral ETT  Additional Equipment: None  Intra-op Plan:   Post-operative Plan: Extubation in OR  Informed Consent: I have reviewed the patients History and Physical, chart, labs and discussed the procedure including the risks, benefits and alternatives for the proposed anesthesia with the patient or authorized representative who has indicated his/her understanding and acceptance.     Dental advisory given  Plan Discussed with: CRNA  Anesthesia Plan Comments: (Pt ate large meal ~ 3-4 hours ago. Discussed with Dr. Percell Miller, but he said this is an emergency and refused to wait for NPO status. Will plan RSI)       Anesthesia  Quick Evaluation

## 2018-08-18 NOTE — ED Provider Notes (Signed)
Patient is a 68 year old male presenting after an animal bite to his right forearm, this is on the extensor surface, he has a linear type laceration to the dorsum of the distal forearm as well as a larger wound to the mid dorsal forearm.  There is fascia exposed, the fascia has been violated and deep tissue muscle has been exposed as well.  There is no arterial bleeding.  His pulses are normal at the wrist and sensation is normal distal to the wound.  He is mildly tachycardic, otherwise the patient appears well, he is up-to-date on tetanus and the dog is up-to-date on rabies vaccinations.  This occurred approximately 30 minutes ago.  Anticipate the need for operating room washout as this is a deep tissue contaminated wound.  Dr. Percell Tameyah Koch took the patient to the operating room  Medical screening examination/treatment/procedure(s) were conducted as a shared visit with non-physician practitioner(s) and myself.  I personally evaluated the patient during the encounter.  Clinical Impression:   Final diagnoses:  Open wound of right forearm due to dog bite         Noemi Chapel, MD 08/20/18 [phone]

## 2018-08-18 NOTE — H&P (Signed)
ORTHOPAEDIC CONSULTATION  REQUESTING PHYSICIAN: No att. providers found  Chief Complaint: Dog bite right arm  HPI: Eddie Carpenter is a 68 y.o. male who was speaking with a neighbor when her dog who is well-known to him bit him in his right forearm and hand.  He presented to the emergency department where the wound was irrigated and dressed.  X-rays did not show any osseous abnormalities.  Orthopedics was consulted for irrigation and debridement as well as definitive closure in the operating room.  The patient is denies any significant pain.  He denies numbness or paresthesia.  He is a former smoker.  He has diabetes reportedly under control.  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  He reports his listed penicillin allergy was as a child-unknown, but not anaphylaxis.  Past Medical History:  Diagnosis Date  . Diabetes mellitus without complication ([place])   . Prostate cancer [facility])     Social History   Socioeconomic History  . Marital status: Married    Spouse name: Not on file  . Number of children: Not on file  . Years of education: Not on file  . Highest education level: Not on file  Occupational History  . Not on file  Social Needs  . Financial resource strain: Not on file  . Food insecurity    Worry: Not on file    Inability: Not on file  . Transportation needs    Medical: Not on file    Non-medical: Not on file  Tobacco Use  . Smoking status: Never Smoker  Substance and Sexual Activity  . Alcohol use: Never    Frequency: Never  . Drug use: Never  . Sexual activity: Not on file  Lifestyle  . Physical activity    Days per week: Not on file    Minutes per session: Not on file  . Stress: Not on file  Relationships  . Social Herbalist on phone: Not on file    Gets together: Not on file    Attends religious service: Not on file    Active member of club or organization: Not on file    Attends meetings of clubs or organizations: Not on file   Relationship status: Not on file  Other Topics Concern  . Not on file  Social History Narrative  . Not on file   No family history on file. Allergies  Allergen Reactions  . Penicillins Other (See Comments)    Did it involve swelling of the face/tongue/throat, SOB, or low BP? Unknown Did it involve sudden or severe rash/hives, skin peeling, or any reaction on the inside of your mouth or nose? Unknown Did you need to seek medical attention at a hospital or doctor's office? Unknown When did it last happen?Childhood If all above answers are "NO", may proceed with cephalosporin use.    Prior to Admission medications   Medication Sig Start Date End Date Taking? Authorizing Provider  JANUVIA 100 MG tablet Take 100 mg by mouth at bedtime.  07/12/18  Yes [provider]  lisinopril (ZESTRIL) 5 MG tablet Take 5 mg by mouth at bedtime.  05/26/18  Yes [provider]  metFORMIN (GLUCOPHAGE-XR) 500 MG 24 hr tablet Take 2,000 mg by mouth at bedtime. 05/26/18  Yes [provider]  pioglitazone (ACTOS) 15 MG tablet Take 1 tablet by mouth at bedtime.  05/26/18  Yes [provider]  simvastatin (ZOCOR) 40 MG tablet Take 40 mg by mouth  at bedtime.  05/26/18  Yes [provider]   Dg Forearm Right  Result Date: 08/18/2018 CLINICAL DATA:  Dog bites of the right hand and right forearm. EXAM: RIGHT FOREARM - 2 VIEW COMPARISON:  None. FINDINGS: There is no fracture or dislocation or radiodensity in the soft tissues. Large soft tissue laceration to the lateral aspect of the distal forearm. IMPRESSION: Soft tissue defect.  Bones are normal. Electronically Signed   By: Lorriane Shire M.D.   On: 08/18/2018 14:43   Dg Hand Complete Right  Result Date: 08/18/2018 CLINICAL DATA:  Dog bite to the right hand. EXAM: RIGHT HAND - COMPLETE 3+ VIEW COMPARISON:  None. FINDINGS: There is a soft tissue wound to the ulnar aspect of the hand and wrist. No fracture or other acute bone  abnormality. Slight osteoarthritic changes of the IP joints. IMPRESSION: Soft tissue wound.  No acute bone abnormality. Electronically Signed   By: Lorriane Shire M.D.   On: 08/18/2018 14:44    Positive ROS: All other systems have been reviewed and were otherwise negative with the exception of those mentioned in the HPI and as above.  Objective: Labs cbc Recent Labs    08/18/18 1501  WBC 7.3  HGB 14.2  HCT 42.2  PLT 157    Labs inflam No results for input(s): CRP in the last 72 hours.  Invalid input(s): ESR  Labs coag No results for input(s): INR, PTT in the last 72 hours.  Invalid input(s): PT  Recent Labs    08/18/18 1501  NA 137  K 4.1  CL 106  CO2 24  GLUCOSE 302*  BUN 19  CREATININE 1.03  CALCIUM 9.5    Physical Exam: Vitals:   08/18/18 1513 08/18/18 1600  BP: (!) 157/74 (!) 163/82  Pulse: 90 82  Resp: 18   Temp:    SpO2: 95% 95%   General: Alert, no acute distress.  Calm, conversant. Mental status: Alert and Oriented x3 Neurologic: Speech Clear and organized, no gross focal findings or movement disorder appreciated. Respiratory: No cyanosis, no use of accessory musculature Cardiovascular: No pedal edema GI: Abdomen is soft and non-tender, non-distended. Skin: Warm and dry.   Extremities: Warm and well perfused w/o edema Psychiatric: Patient is competent for consent with normal mood and affect  MUSCULOSKELETAL:  Right upper extremity: Large volar midforearm laceration with exposed muscle and fascia.  No active bleeding.  Approximately 3 to 4 cm transverse laceration dorsal right hand.  Several puncture injuries palmar surface of the hand.  Motor function and sensation intact.   Other extremities are atraumatic with painless ROM and NVI.  Assessment / Plan: Principal Problem:   Dog bite of right upper extremity   Right upper extremity dog bite Plan for irrigation debridement and complex closure in the operating room today. Clindamycin given in  the emergency department Plan for Ancef in the OR and p.o. antibiotics at discharge.  The risks benefits and alternatives of the procedure were discussed with the patient including but not limited to infection, bleeding, nerve injury, the need for revision surgery, blood clots, cardiopulmonary complications, morbidity, mortality, among others.  The patient verbalizes understanding and wishes to proceed.     Contact information:  Edmonia Lynch MD, Roxan Hockey PA-C  Prudencio Burly III PA-C 08/18/2018 5:02 PM

## 2018-08-18 NOTE — Discharge Instructions (Addendum)
Elevate arm as much as possible to reduce pain / swelling.  Take Antibiotic as prescribed.  Rx sent to [facility] [address]  Diet: As you were doing prior to hospitalization   Shower:  May shower but keep the wounds dry, use an occlusive plastic wrap, NO SOAKING IN TUB.    Dressing:  Keep dressings on and dry until follow up.  Activity:  Increase activity slowly as tolerated, but follow the weight bearing instructions below.  The rules on driving is that you can not be taking narcotics while you drive, and you must feel in control of the vehicle.    Weight Bearing:   No lifting right arm  To prevent constipation: you may use a stool softener such as -  Colace (over the counter) 100 mg by mouth twice a day  Drink plenty of fluids (prune juice may be helpful) and high fiber foods Miralax (over the counter) for constipation as needed.    Itching:  If you experience itching with your medications, try taking only a single pain pill, or even half a pain pill at a time.  You can also use benadryl over the counter for itching or also to help with sleep.   Precautions:  If you experience chest pain or shortness of breath - call 911 immediately for transfer to the hospital emergency department!!  If you develop a fever greater that 101 F, purulent drainage from wound, increased redness or drainage from wound, or calf pain -- Call the office at [phone]                                                 Follow- Up Appointment:  Please call for an appointment to be seen in 1 weeks [place] - [phone]

## 2018-08-18 NOTE — Op Note (Signed)
08/18/2018  5:54 PM  PATIENT:  Eddie Carpenter    PRE-OPERATIVE DIAGNOSIS:  right arm laceration  POST-OPERATIVE DIAGNOSIS:  Same  PROCEDURE:  IRRIGATION AND DEBRIDEMENT RIGHT ARM AND REPAIR OF LACERATION  SURGEON:  Renette Butters, MD  ASSISTANT: Roxan Hockey, PA-C, he was present and scrubbed throughout the case, critical for completion in a timely fashion, and for retraction, instrumentation, and closure.   ANESTHESIA:   gen  PREOPERATIVE INDICATIONS:  [name] is a  68 y.o. male with a diagnosis of right arm laceration who failed conservative measures and elected for surgical management.    The risks benefits and alternatives were discussed with the patient preoperatively including but not limited to the risks of infection, bleeding, nerve injury, cardiopulmonary complications, the need for revision surgery, among others, and the patient was willing to proceed.  OPERATIVE IMPLANTS: none  OPERATIVE FINDINGS: fascial violation, 24 cm of laceration  BLOOD LOSS: 20  COMPLICATIONS: none  TOURNIQUET TIME: none  OPERATIVE PROCEDURE:  Patient was identified in the preoperative holding area and site was marked by me He was transported to the operating theater and placed on the table in supine position taking care to pad all bony prominences. After a preincinduction time out anesthesia was induced. The right upper extremity was prepped and draped in normal sterile fashion and a pre-incision timeout was performed. He received ancef for preoperative antibiotics.   I first performed a thorough irrigation of his wounds explored and found no major vessel or nerve or tendon injury.  I performed a debridement with irrigator and pickups soft tissue.  I also used scissors.  I then performed a complex closure of 24 cm of laceration to his dorsal arm and wrist.  A sterile dressing was then applied he was awoken and taken to the PACU in stable condition  POST OPERATIVE PLAN:  dressing full time, Abx x 1 wk, mobilize for dvt px

## 2018-08-19 ENCOUNTER — Encounter (HOSPITAL_COMMUNITY): Payer: Self-pay | Admitting: Orthopedic Surgery

## 2019-03-30 ENCOUNTER — Ambulatory Visit: Payer: BC Managed Care – PPO | Attending: Internal Medicine

## 2019-03-30 DIAGNOSIS — Z23 Encounter for immunization: Secondary | ICD-10-CM | POA: Insufficient documentation

## 2019-03-30 NOTE — Progress Notes (Signed)
   Covid-19 Vaccination Clinic  Name:  Eddie Carpenter    MRN: [id number] DOB: 1950/03/07  03/30/2019  Mr. Eddie Carpenter was observed post Covid-19 immunization for 15 minutes without incidence. He was provided with Vaccine Information Sheet and instruction to access the V-Safe system.   Mr. Eddie Carpenter was instructed to call 911 with any severe reactions post vaccine: Marland Kitchen Difficulty breathing  . Swelling of your face and throat  . A fast heartbeat  . A bad rash all over your body  . Dizziness and weakness    Immunizations Administered    Name Date Dose VIS Date Route   Pfizer COVID-19 Vaccine 03/30/2019  5:30 PM 0.3 mL 01/31/2019 Intramuscular   Manufacturer: [place]   Lot: [id number]   [place]: [id number]

## 2019-04-03 ENCOUNTER — Other Ambulatory Visit: Payer: Self-pay

## 2019-04-03 ENCOUNTER — Encounter: Payer: Self-pay | Admitting: Cardiology

## 2019-04-03 ENCOUNTER — Ambulatory Visit: Payer: BC Managed Care – PPO | Admitting: Cardiology

## 2019-04-03 VITALS — BP 155/73 | HR 84 | Temp 97.3°F | Ht 68.0 in | Wt 245.1 lb

## 2019-04-03 DIAGNOSIS — I350 Nonrheumatic aortic (valve) stenosis: Secondary | ICD-10-CM | POA: Diagnosis not present

## 2019-04-03 DIAGNOSIS — I10 Essential (primary) hypertension: Secondary | ICD-10-CM

## 2019-04-03 DIAGNOSIS — E7800 Pure hypercholesterolemia, unspecified: Secondary | ICD-10-CM

## 2019-04-03 DIAGNOSIS — R0989 Other specified symptoms and signs involving the circulatory and respiratory systems: Secondary | ICD-10-CM | POA: Diagnosis not present

## 2019-04-03 DIAGNOSIS — I129 Hypertensive chronic kidney disease with stage 1 through stage 4 chronic kidney disease, or unspecified chronic kidney disease: Secondary | ICD-10-CM

## 2019-04-03 DIAGNOSIS — N1831 Chronic kidney disease, stage 3a: Secondary | ICD-10-CM

## 2019-04-03 DIAGNOSIS — E118 Type 2 diabetes mellitus with unspecified complications: Secondary | ICD-10-CM

## 2019-04-03 MED ORDER — LISINOPRIL 20 MG PO TABS: 20.0000 mg | ORAL_TABLET | Freq: Every day | ORAL | 1 refills | Status: DC

## 2019-04-03 NOTE — Progress Notes (Signed)
Primary Physician/Referring:  Aletha Halim., PA-C  Patient ID: Eddie Carpenter, male    DOB: 18-Mar-1950, 69 y.o.   MRN: [id number]  Chief Complaint  Patient presents with  . Heart Murmur  . New Patient (Initial Visit)   HPI:    Eddie Carpenter  is a 69 y.o. with HTN, HLD, DM, referred to [place] by Bing Matter, PA for evaluation of heart murmur. Last seen in 2013 for palpitations found to be PVC's and systolic aortic ejection murmur.  He remains asymptomatic and works as a Secondary school teacher for [employer].  Enjoys his work. No chest pain or dyspnea or palpitations.   Past Medical History:  Diagnosis Date  . Diabetes mellitus without complication ([place])   . Hyperlipidemia   . Prostate cancer [facility])    Past Surgical History:  Procedure Laterality Date  . I & D EXTREMITY Right 08/18/2018   Procedure: IRRIGATION AND DEBRIDEMENT RIGHT ARM AND REPAIR OF LACERATION;  Surgeon: Renette Butters, MD;  Location: [place];  Service: Orthopedics;  Laterality: Right;   Social History   Tobacco Use  . Smoking status: Former Smoker    Start date: 2018  . Smokeless tobacco: Never Used  Substance Use Topics  . Alcohol use: Never    ROS  Review of Systems  Cardiovascular: Negative for dyspnea on exertion and leg swelling.  Gastrointestinal: Negative for melena.   Objective  Blood pressure (!) 155/73, pulse 84, temperature (!) 97.3 F (36.3 C), height 5\' 8"  (1.727 m), weight 245 lb 1.6 oz (111.2 kg), SpO2 96 %.  Vitals with BMI 04/03/2019 08/18/2018 08/18/2018  Height 5\' 8"  - -  Weight 245 lbs 2 oz - -  BMI 0000000 - -  Systolic [id number]  Diastolic 73 72 78  Pulse 84 79 79     Physical Exam  Neck: No thyromegaly present.  Cardiovascular: Normal rate, regular rhythm and intact distal pulses. Exam reveals no gallop.  Murmur heard.  Harsh midsystolic murmur is present with a grade of 3/6 at the upper right sternal border radiating to the neck. No leg edema, no JVD.  Pulmonary/Chest: Effort  normal and breath sounds normal.  Abdominal: Soft. Bowel sounds are normal.  Musculoskeletal:     Cervical back: Neck supple.  Skin: Skin is warm and dry.   Laboratory examination:   Recent Labs    08/18/18 1501  NA 137  K 4.1  CL 106  CO2 24  GLUCOSE 302*  BUN 19  CREATININE 1.03  CALCIUM 9.5  GFRNONAA >60  GFRAA >60   CrCl cannot be calculated (Patient's most recent lab result is older than the maximum 21 days allowed.).  CMP Latest Ref Rng & Units 08/18/2018 04/21/2010 04/20/2010  Glucose 70 - 99 mg/dL 302(H) 105(H) 176(H)  BUN 8 - 23 mg/dL 19 11 14   Creatinine 0.61 - 1.24 mg/dL 1.03 0.89 0.98  Sodium 135 - 145 mmol/L 137 138 138  Potassium 3.5 - 5.1 mmol/L 4.1 4.0 4.3  Chloride 98 - 111 mmol/L 106 108 104  CO2 22 - 32 mmol/L 24 25 26   Calcium 8.9 - 10.3 mg/dL 9.5 8.1(L) 8.4   CBC Latest Ref Rng & Units 08/18/2018 04/21/2010 04/20/2010  WBC 4.0 - 10.5 K/uL 7.3 9.8 13.3(H)  Hemoglobin 13.0 - 17.0 g/dL 14.2 13.0 14.4  Hematocrit 39.0 - 52.0 % 42.2 39.3 42.6  Platelets 150 - 400 K/uL 157 138(L) 165   Lipid Panel  No results found for: CHOL, TRIG,  HDL, CHOLHDL, VLDL, LDLCALC, LDLDIRECT HEMOGLOBIN A1C No results found for: HGBA1C, MPG TSH No results for input(s): TSH in the last 8760 hours.  External labs 03/05/2019:  Sodium 140 135 - 146 MMOL/L  Potassium 4.7 3.5 - 5.3 MMOL/L  Chloride 106 98 - 110 MMOL/L  CO2 28 23 - 30 MMOL/L  BUN 21 8 - 24 MG/DL  Glucose 277 (H)  Comment: Patients taking eltrombopag at doses >/= 100 mg daily may show falsely elevated values of 10% or greater. 70 - 99 MG/DL  Creatinine 1.43 0.5 - 1.5 MG/DL  Calcium 9.7 8.5 - 10.5 MG/DL  Total Protein 6.8  Comment: Patients taking eltrombopag at doses >/= 100 mg daily may show falsely elevated values of 10% or greater. 6 - 8.3 G/DL  Albumin  4.2 3.5 - 5 G/DL  Total Bilirubin 0.7  Comment: Patients taking eltrombopag at doses >/= 100 mg daily may show falsely elevated values of 10% or greater. 0.1 -  1.2 MG/DL  Alkaline Phosphatase 35 25 - 125 IU/L or U/L  AST (SGOT) 15 5 - 40 IU/L or U/L  ALT (SGPT) 21 5 - 50 IU/L or U/L  Anion Gap 7 4 - 14 MMOL/L  Est. GFR Non-African American 50 (L)  Comment: GFR estimated by CKD-EPI equations, reportable up to 90 ML/MIN/1.73 M*2    LDL Direct 87 <130 mg/dL  Total Cholesterol 131 25 - 199 MG/DL  Triglycerides 86 10 - 150 MG/DL  HDL Cholesterol 36 35 - 135 MG/DL  Total Chol / HDL Cholesterol 3.6 <4.5   Non-HDL Cholesterol 95  Comment:  TARGET: <(LDL-C TARGET + 30)MG/DL    WBC 5.5 4.4 - 11.0 x 10*3/uL [facility]   RBC 4.75 4.50 - 5.90 x 10*6/uL [facility]   Hemoglobin 14.7 14.0 - 17.5 G/DL [facility]   Hematocrit 44.1 41.5 - 50.4 % [facility]   MCV 92.8 80.0 - 96.0 FL [facility]   MCH 31.1 27.5 - 33.2 PG [facility]   MCHC 33.5 33.0 - 37.0 G/DL [facility]   RDW 14.1 12.3 - 17.0 % [facility]   Platelets 183 150 - 450 X 10*3/uL [facility]   MPV 9.8 6.8 - 10.2 FL [facility]   Neutrophil % 62 % [facility]   Lymphocyte % 25 % [facility]   Monocyte % 10 % [facility]   Eosinophil % 2 % [facility]   Basophil % 1 % [facility]   Neutrophil Absolute 3.5 1.8 - 7.8 x 10*3/uL [facility]   Lymphocyte Absolute 1.4 1.0 - 4.8 x 10*3/uL [facility]   Monocyte  Absolute 0.6 0.0 - 0.8 x 10*3/uL [facility]   Eosinophil Absolute 0.1 0.0 - 0.5 x 10*3/uL [facility]   Basophil Absolute 0.0 0.0 - 0.2 x 10*3/uL     Medications and allergies   Allergies  Allergen Reactions  . Penicillins Other (See Comments)  Did it involve swelling of the face/tongue/throat, SOB, or low BP? Unknown Did it involve sudden or severe rash/hives, skin peeling, or any reaction on the inside of your mouth or nose? Unknown Did you need to seek medical attention at a hospital or doctor's office? Unknown When did it last happen?Childhood If all above answers are "NO", may proceed with cephalosporin use.     Current Outpatient Medications  Medication Instructions  . aspirin EC 81 mg, Oral, Daily  . Januvia 100 mg, Oral, Daily at bedtime  . lisinopril (ZESTRIL) 20 mg, Oral, Daily at bedtime  . metFORMIN (GLUCOPHAGE-XR) 2,000 mg, Oral, Daily at bedtime  . ondansetron (ZOFRAN) 4 mg, Oral, Every 8 hours PRN  . pioglitazone (ACTOS) 15 MG tablet 1 tablet, Oral, Daily at bedtime  . simvastatin (ZOCOR) 40 mg, Oral, Daily at bedtime    Radiology:  No results found.  Cardiac Studies:   Echocardiogram done on 12/17/2011 reveals normal left systolic function. Moderate left ventricular. No significant valvular abnormality.  Exercise sestamibi stress test done on 12/22/2011 revealed that patient was able to walk for 8 minutes and achieved 10 metastases. There is no evidence of ischemia. There was mild diaphragmatic attenuation artifact. Overall a low risk stress test.  Carotid duplex that was performed to evaluate carotid bruit on 11/04/2011 revealed mild heterogeneous plaque without significant carotid stenosis  Assessment     ICD-10-CM   1. Nonrheumatic aortic valve stenosis  I35.0 EKG 12-Lead    PCV ECHOCARDIOGRAM COMPLETE  2. Primary hypertension  I10 lisinopril (ZESTRIL) 20 MG tablet     PCV ECHOCARDIOGRAM COMPLETE    BASIC METABOLIC PANEL WITH GFR  3. Bilateral carotid bruits  R09.89   4. Hypercholesteremia  E78.00   5. Type 2 diabetes mellitus with complication, without long-term current use of insulin (HCC)  E11.8   6. Stage 3a chronic kidney disease  N18.31     EKG 03/28/2019: Sinus rhythm with first-degree block with a rate of 79 bpm, normal axis, poor R wave progression, probably normal variant but cannot exclude anteroseptal infarct old.  No evidence of ischemia, otherwise normal EKG.   Meds ordered this encounter  Medications  . lisinopril (ZESTRIL) 20 MG tablet    Sig: Take 1 tablet (20 mg total) by mouth at bedtime.    Dispense:  90 tablet    Refill:  1    Medications Discontinued During This Encounter  Medication Reason  . lisinopril (ZESTRIL) 5 MG tablet Reorder     Recommendations:   Eddie Carpenter  is a 69 y.o. Caucasian male patient with HTN, HLD, DM, referred to [place] by Bing Matter, PA for evaluation of heart murmur. Last seen in 2013 for palpitations found to be PVC's and systolic aortic ejection murmur.  Patient was last seen by me about 6-7 years ago, he now has at least moderate if not moderately severe aortic stenosis by physical examination.  He needs echocardiogram.  Her graft blood pressure is uncontrolled, will increase lisinopril from 5 mg to 20 mg daily, he does have uncontrolled diabetes mellitus and also has underlying stage III chronic kidney disease hence needs BMP in 2 weeks.  I discussed the complications of medications and also discussed the effects of diabetes and kidney disease.  Lipids are fairly well controlled but would like to have a target LDL <70 in view of diabetes state.  He does have bilateral carotid bruit that is also noticed several years ago suspect it is a conducted sounds from  aortic stenosis.  I don't think he needs carotid artery duplex for now.  He is on statin, continue present medications otherwise, I would  highly recommend the patient go on insulin for diabetes control, could also consider Saxenda which may help with DM and weight loss as well.    Adrian Prows, MD, [facility] 04/03/2019, [facility]. [place] Office: [phone]

## 2019-04-16 ENCOUNTER — Other Ambulatory Visit: Payer: BC Managed Care – PPO

## 2019-04-17 ENCOUNTER — Other Ambulatory Visit: Payer: Self-pay | Admitting: Cardiology

## 2019-04-17 ENCOUNTER — Ambulatory Visit: Payer: BC Managed Care – PPO

## 2019-04-17 DIAGNOSIS — I10 Essential (primary) hypertension: Secondary | ICD-10-CM

## 2019-04-18 ENCOUNTER — Encounter: Payer: Self-pay | Admitting: Cardiology

## 2019-04-18 ENCOUNTER — Other Ambulatory Visit: Payer: Self-pay | Admitting: Cardiology

## 2019-04-18 DIAGNOSIS — I10 Essential (primary) hypertension: Secondary | ICD-10-CM

## 2019-04-18 LAB — BASIC METABOLIC PANEL
BUN/Creatinine Ratio: 14 (ref 10–24)
BUN: 16 mg/dL (ref 8–27)
CO2: 23 mmol/L (ref 20–29)
Calcium: 9.9 mg/dL (ref 8.6–10.2)
Chloride: 104 mmol/L (ref 96–106)
Creatinine, Ser: 1.16 mg/dL (ref 0.76–1.27)
GFR calc Af Amer: 75 mL/min/{1.73_m2} (ref >59)
GFR calc non Af Amer: 65 mL/min/{1.73_m2} (ref >59)
Glucose: 229 mg/dL — ABNORMAL HIGH (ref 65–99)
Potassium: 5.6 mmol/L — ABNORMAL HIGH (ref 3.5–5.2)
Sodium: 142 mmol/L (ref 134–144)

## 2019-04-18 MED ORDER — LISINOPRIL 5 MG PO TABS: 5.0000 mg | ORAL_TABLET | Freq: Every day | ORAL | 3 refills | Status: DC

## 2019-04-18 NOTE — Progress Notes (Signed)
Let patient know to take only 5 mg lisinopril and stop the 20 mg I Rx. Hyperkalemia. I have sent message to him

## 2019-04-18 NOTE — Progress Notes (Signed)
I have discontinued high-dose lisinopril 20 mg that was started previously for hypertension, patient developed hyperkalemia.  We will send a message for patient to restart 5 mg once a day that he was previously on.  I will address his hypertension on his next office visit.    ICD-10-CM   1. Primary hypertension  99991111 Basic metabolic panel    Basic metabolic panel    lisinopril (ZESTRIL) 5 MG tablet    Adrian Prows, MD, [facility] 04/18/2019, 6:12 PM [facility]. [place] Office: [phone]

## 2019-04-21 NOTE — Progress Notes (Signed)
Called pt to inform him about message above.

## 2019-04-25 ENCOUNTER — Ambulatory Visit: Payer: BC Managed Care – PPO | Attending: Internal Medicine

## 2019-04-25 DIAGNOSIS — Z23 Encounter for immunization: Secondary | ICD-10-CM | POA: Insufficient documentation

## 2019-04-25 NOTE — Progress Notes (Signed)
   Covid-19 Vaccination Clinic  Name:  Eddie Carpenter    MRN: [id number] DOB: December 01, 1950  04/25/2019  Eddie Carpenter was observed post Covid-19 immunization for 15 minutes without incident. He was provided with Vaccine Information Sheet and instruction to access the V-Safe system.   Eddie Carpenter was instructed to call 911 with any severe reactions post vaccine: Marland Kitchen Difficulty breathing  . Swelling of face and throat  . A fast heartbeat  . A bad rash all over body  . Dizziness and weakness   Immunizations Administered    Name Date Dose VIS Date Route   Pfizer COVID-19 Vaccine 04/25/2019  9:57 AM 0.3 mL 01/31/2019 Intramuscular   Manufacturer: [place]   Lot: [id number]   [place]: [id number]

## 2019-05-01 ENCOUNTER — Other Ambulatory Visit: Payer: Self-pay

## 2019-05-01 ENCOUNTER — Ambulatory Visit: Payer: BC Managed Care – PPO

## 2019-05-01 DIAGNOSIS — I350 Nonrheumatic aortic (valve) stenosis: Secondary | ICD-10-CM

## 2019-05-01 DIAGNOSIS — I10 Essential (primary) hypertension: Secondary | ICD-10-CM

## 2019-06-04 ENCOUNTER — Other Ambulatory Visit: Payer: Self-pay

## 2019-06-04 ENCOUNTER — Encounter: Payer: Self-pay | Admitting: Cardiology

## 2019-06-04 ENCOUNTER — Ambulatory Visit: Payer: BC Managed Care – PPO | Admitting: Cardiology

## 2019-06-04 VITALS — BP 144/80 | HR 91 | Temp 95.9°F | Resp 16 | Ht 68.0 in | Wt 241.8 lb

## 2019-06-04 DIAGNOSIS — E7800 Pure hypercholesterolemia, unspecified: Secondary | ICD-10-CM

## 2019-06-04 DIAGNOSIS — I358 Other nonrheumatic aortic valve disorders: Secondary | ICD-10-CM

## 2019-06-04 DIAGNOSIS — E119 Type 2 diabetes mellitus without complications: Secondary | ICD-10-CM

## 2019-06-04 DIAGNOSIS — I10 Essential (primary) hypertension: Secondary | ICD-10-CM

## 2019-06-04 MED ORDER — DILTIAZEM HCL ER COATED BEADS 240 MG PO CP24: 240.0000 mg | ORAL_CAPSULE | Freq: Every day | ORAL | 1 refills | Status: DC

## 2019-06-04 MED ORDER — ROSUVASTATIN CALCIUM 20 MG PO TABS: 20.0000 mg | ORAL_TABLET | Freq: Every day | ORAL | 2 refills | Status: DC

## 2019-06-04 NOTE — Progress Notes (Signed)
Primary Physician/Referring:  Aletha Halim., PA-C  Patient ID: Eddie Carpenter, male    DOB: 16-Apr-1950, 69 y.o.   MRN: [id number]  Chief Complaint  Patient presents with  . Hypertension  . Results    Echo  . Follow-up    6 week   HPI:    Eddie Carpenter  is a 69 y.o. with HTN, HLD, DM with stage 3 CKD, presents for follow-up of aortic stenotic murmur and hypertension.  He was seen by me about 2 months ago. He remains asymptomatic and works as a Secondary school teacher for [employer]  Enjoys his work. No chest pain or dyspnea or palpitations.   Past Medical History:  Diagnosis Date  . Diabetes mellitus without complication ([place])   . Hyperlipidemia   . Prostate cancer [facility])    Past Surgical History:  Procedure Laterality Date  . I & D EXTREMITY Right 08/18/2018   Procedure: IRRIGATION AND DEBRIDEMENT RIGHT ARM AND REPAIR OF LACERATION;  Surgeon: Renette Butters, MD;  Location: [place];  Service: Orthopedics;  Laterality: Right;   Social History   Tobacco Use  . Smoking status: Former Smoker    Start date: 2018  . Smokeless tobacco: Never Used  Substance Use Topics  . Alcohol use: Never  Marital Status: Married   ROS  Review of Systems  Cardiovascular: Negative for dyspnea on exertion and leg swelling.  Gastrointestinal: Negative for melena.   Objective  Blood pressure (!) 144/80, pulse 91, temperature (!) 95.9 F (35.5 C), temperature source Temporal, resp. rate 16, height '5\' 8"'$  (1.727 m), weight 241 lb 12.8 oz (109.7 kg), SpO2 96 %.  Vitals with BMI 06/04/2019 04/03/2019 08/18/2018  Height '5\' 8"'$  '5\' 8"'$  -  Weight 241 lbs 13 oz 245 lbs 2 oz -  BMI 37.54 36.06 -  Systolic 770 340 352  Diastolic 80 73 72  Pulse 91 84 79     Physical Exam  Cardiovascular: Normal rate, regular rhythm and intact distal pulses. Exam reveals no gallop.  Murmur heard.  Harsh midsystolic murmur is present with a grade of 2/6 at the upper right sternal border radiating to the neck. Pulses:       Carotid pulses are on the right side with bruit and on the left side with bruit. No leg edema, no JVD.  Bilateral varicose veins noted in the calf.  Pulmonary/Chest: Effort normal and breath sounds normal.  Abdominal: Soft. Bowel sounds are normal.   Laboratory examination:   Recent Labs    08/18/18 1501 04/17/19 1200  NA 137 142  K 4.1 5.6*  CL 106 104  CO2 24 23  GLUCOSE 302* 229*  BUN 19 16  CREATININE 1.03 1.16  CALCIUM 9.5 9.9  GFRNONAA >60 65  GFRAA >60 75   CrCl cannot be calculated (Patient's most recent lab result is older than the maximum 21 days allowed.).  CMP Latest Ref Rng & Units 04/17/2019 08/18/2018 04/21/2010  Glucose 65 - 99 mg/dL 229(H) 302(H) 105(H)  BUN 8 - 27 mg/dL '16 19 11  '$ Creatinine 0.76 - 1.27 mg/dL 1.16 1.03 0.89  Sodium 134 - 144 mmol/L 142 137 138  Potassium 3.5 - 5.2 mmol/L 5.6(H) 4.1 4.0  Chloride 96 - 106 mmol/L 104 106 108  CO2 20 - 29 mmol/L '23 24 25  '$ Calcium 8.6 - 10.2 mg/dL 9.9 9.5 8.1(L)   CBC Latest Ref Rng & Units 08/18/2018 04/21/2010 04/20/2010  WBC 4.0 - 10.5 K/uL 7.3 9.8 13.3(H)  Hemoglobin  13.0 - 17.0 g/dL 14.2 13.0 14.4  Hematocrit 39.0 - 52.0 % 42.2 39.3 42.6  Platelets 150 - 400 K/uL 157 138(L) 165   External labs 03/05/2019:   Sodium 140, potassium 4.7, BUN 21, creatinine 1.43, EGFR 50 mL, serum glucose 277.  Hb 14.7/HCT 44.1, platelets 183.  Normal indicis.  HEMOGLOBIN A1C  9.9 (H)   <5.7 %   LDL Direct 87 <130 mg/dL  Total Cholesterol 131 25 - 199 MG/DL  Triglycerides 86 10 - 150 MG/DL  HDL Cholesterol 36 35 - 135 MG/DL  Total Chol / HDL Cholesterol 3.6 <4.5   Non-HDL Cholesterol 95      Medications and allergies   Allergies  Allergen Reactions  . Penicillins Other (See Comments)    Did it involve swelling of the face/tongue/throat, SOB, or low BP? Unknown Did it involve sudden or severe rash/hives, skin peeling, or any reaction on the inside of your mouth or nose? Unknown Did you need to seek medical  attention at a hospital or doctor's office? Unknown When did it last happen?Childhood If all above answers are "NO", may proceed with cephalosporin use.   Marland Kitchen Lisinopril Other (See Comments)    Hyperkalemia at high dose    Current Outpatient Medications  Medication Instructions  . aspirin EC 81 mg, Oral, Daily  . diltiazem (CARDIZEM CD) 240 mg, Oral, Daily  . Januvia 100 mg, Oral, Daily at bedtime  . lisinopril (ZESTRIL) 5 mg, Oral, Daily  . metFORMIN (GLUCOPHAGE-XR) 2,000 mg, Oral, Daily at bedtime  . pioglitazone (ACTOS) 15 MG tablet 1 tablet, Oral, Daily at bedtime  . rosuvastatin (CRESTOR) 20 mg, Oral, Daily   Radiology:  No results found.  Cardiac Studies:   Exercise sestamibi stress test done on 12/22/2011 revealed that patient was able to walk for 8 minutes and achieved 10 metastases. There is no evidence of ischemia. There was mild diaphragmatic attenuation artifact. Overall a low risk stress test.  Carotid duplex that was performed to evaluate carotid bruit on 11/04/2011 revealed mild heterogeneous plaque without significant carotid stenosis.  Echocardiogram 05/01/2019:  Hyperdynamic LV systolic function with visual EF >70%. Left ventricle  cavity is normal in size. Mild left ventricular hypertrophy. Normal global  wall motion. Doppler evidence of grade II diastolic dysfunction, elevated  LAP. Calculated EF 67%.  Left atrial cavity is mildly dilated.  Peak velocity 2.3ms, mean gradient 130mg, Dimensional index 0.5, findings suggestive of aortic sclerosis without stenosis.  Mild mitral annular calcification. Mild mitral regurgitation.  No other significant valvular abnormalities.  No significant change compared to prior study dated 12/17/2011.  EKG:  EKG 03/28/2019: Sinus rhythm with first-degree block with a rate of 79 bpm, normal axis, poor R wave progression, probably normal variant but cannot exclude anteroseptal infarct old.  No evidence of ischemia, otherwise  normal EKG.   Assessment     ICD-10-CM   1. Primary hypertension  I10 diltiazem (CARDIZEM CD) 240 MG 24 hr capsule  2. Aortic valve sclerosis  I35.8   3. Hypercholesteremia  E78.00 rosuvastatin (CRESTOR) 20 MG tablet    Lipid Panel With LDL/HDL Ratio    Lipid Panel With LDL/HDL Ratio  4. Type 2 diabetes mellitus without complication, without long-term current use of insulin (HCC)  E11.9 Hgb A1c w/o eAG    Hgb A1c w/o eAG    Meds ordered this encounter  Medications  . diltiazem (CARDIZEM CD) 240 MG 24 hr capsule    Sig: Take 1 capsule (240 mg total) by mouth  daily.    Dispense:  30 capsule    Refill:  1  . rosuvastatin (CRESTOR) 20 MG tablet    Sig: Take 1 tablet (20 mg total) by mouth daily.    Dispense:  30 tablet    Refill:  2    Discontinue Zocor    Medications Discontinued During This Encounter  Medication Reason  . ondansetron (ZOFRAN) 4 MG tablet No longer needed (for PRN medications)  . simvastatin (ZOCOR) 40 MG tablet Change in therapy     Recommendations:   KEL SENN  is a 69 y.o. with HTN, HLD, DM with stage 3 CKD, presents for follow-up of aortic stenotic murmur and hypertension.  He was seen by me about 2 months ago.  On his last office visit obtain echocardiogram and also increase his lisinopril from 5 mg to 10 mg for uncontrolled hypertension.  However he developed hyperkalemia and hence the dose was reduced.  Echocardiogram reveals on the aortic valve sclerosis.  Blood pressure is elevated, is also noted to be tachycardic, I will add diltiazem CD to 40 mg daily.  Lipids are fairly well controlled but would like to have a target LDL <70 in view of diabetes state.  In view of addition of diltiazem to his hypertension, I will discontinue simvastatin and switch him to Crestor 20 mg daily.  If lipids are not controlled, I would consider addition of Zetia.    As he remained stable from cardiac standpoint and remains asymptomatic without chest pain or shortness  of breath, I will see him back in 6 weeks with labs.  Primary prevention discussed extensively.  Would consider addition of Invokana for DM and weight loss.  Adrian Prows, MD, [facility] 06/04/2019, 4:51 PM [facility]. [place] Office: [phone]

## 2019-06-28 ENCOUNTER — Other Ambulatory Visit: Payer: Self-pay | Admitting: Cardiology

## 2019-06-28 DIAGNOSIS — I10 Essential (primary) hypertension: Secondary | ICD-10-CM

## 2019-07-17 LAB — HGB A1C W/O EAG: Hgb A1c MFr Bld: 10.9 % — ABNORMAL HIGH (ref 4.8–5.6)

## 2019-07-17 LAB — LIPID PANEL WITH LDL/HDL RATIO
Cholesterol, Total: 85 mg/dL — ABNORMAL LOW (ref 100–199)
HDL: 34 mg/dL — ABNORMAL LOW (ref >39)
LDL Chol Calc (NIH): 34 mg/dL (ref 0–99)
LDL/HDL Ratio: 1 ratio (ref 0.0–3.6)
Triglycerides: 80 mg/dL (ref 0–149)
VLDL Cholesterol Cal: 17 mg/dL (ref 5–40)

## 2019-07-17 NOTE — Progress Notes (Signed)
Excellent control of cholesterol since switching from simvastatin to Crestor, continue present dose.  Diabetes continues to be very much uncontrolled.  Labs faxed to PCP.

## 2019-07-18 ENCOUNTER — Telehealth: Payer: Self-pay

## 2019-07-18 NOTE — Telephone Encounter (Signed)
Chart states 5 mg daily

## 2019-07-18 NOTE — Telephone Encounter (Signed)
Patient is confused about what dosage of Lisinopril he should be taking currently.   He also has been taking both Simvastatin and Rosuvastatin since his last office visit. I informed patient that you had discontinued simvastatin and that he should only be taking Rosuvastatin.   Lab results also given to patient. He verbalized understanding.

## 2019-07-24 NOTE — Telephone Encounter (Signed)
Called pt to inform him to take 5 mg of lisinopril. Pt understood

## 2019-07-25 ENCOUNTER — Other Ambulatory Visit: Payer: Self-pay | Admitting: Cardiology

## 2019-07-25 DIAGNOSIS — I10 Essential (primary) hypertension: Secondary | ICD-10-CM

## 2019-07-29 ENCOUNTER — Ambulatory Visit: Payer: Medicare Other | Admitting: Cardiology

## 2019-07-30 ENCOUNTER — Other Ambulatory Visit: Payer: Self-pay

## 2019-07-30 DIAGNOSIS — I10 Essential (primary) hypertension: Secondary | ICD-10-CM

## 2019-07-30 MED ORDER — DILTIAZEM HCL ER COATED BEADS 240 MG PO CP24: ORAL_CAPSULE | ORAL | 1 refills | Status: DC

## 2019-08-27 ENCOUNTER — Other Ambulatory Visit: Payer: Self-pay

## 2019-08-27 ENCOUNTER — Ambulatory Visit: Payer: Medicare Other | Admitting: Cardiology

## 2019-08-27 ENCOUNTER — Encounter: Payer: Self-pay | Admitting: Cardiology

## 2019-08-27 VITALS — BP 140/69 | HR 74 | Ht 68.0 in | Wt 228.6 lb

## 2019-08-27 DIAGNOSIS — I358 Other nonrheumatic aortic valve disorders: Secondary | ICD-10-CM

## 2019-08-27 DIAGNOSIS — E7800 Pure hypercholesterolemia, unspecified: Secondary | ICD-10-CM

## 2019-08-27 DIAGNOSIS — I10 Essential (primary) hypertension: Secondary | ICD-10-CM

## 2019-08-27 DIAGNOSIS — N1831 Chronic kidney disease, stage 3a: Secondary | ICD-10-CM

## 2019-08-27 DIAGNOSIS — G4733 Obstructive sleep apnea (adult) (pediatric): Secondary | ICD-10-CM

## 2019-08-27 NOTE — Progress Notes (Signed)
Primary Physician/Referring:  Aletha Halim., PA-C  Patient ID: Eddie Carpenter, male    DOB: [birth year], 69 y.o.   MRN: [id number]  Chief Complaint  Patient presents with  . Follow-up    6-8 week  . Hypertension  . Hyperlipidemia   HPI:    Eddie Carpenter  is a 69 y.o. with HTN, HLD, DM with stage 3 CKD, presents for follow-up of aortic stenotic murmur, OSA on CPAP and hypertension.  He was seen by me about 2 months ago. 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  He is depressed about it but trying to take it in a positive way.  Wife present.   Past Medical History:  Diagnosis Date  . Diabetes mellitus without complication ([place])   . Hyperlipidemia   . Prostate cancer [facility])    Past Surgical History:  Procedure Laterality Date  . I & D EXTREMITY Right 08/18/2018   Procedure: IRRIGATION AND DEBRIDEMENT RIGHT ARM AND REPAIR OF LACERATION;  Surgeon: Renette Butters, MD;  Location: [place];  Service: Orthopedics;  Laterality: Right;   Social History   Tobacco Use  . Smoking status: Former Smoker    Types: Cigarettes    Start date: 2018  . Smokeless tobacco: Never Used  Substance Use Topics  . Alcohol use: Never  Marital Status: Married   ROS  Review of Systems  Cardiovascular: Negative for dyspnea on exertion and leg swelling.  Gastrointestinal: Negative for melena.   Objective  Blood pressure 140/69, pulse 74, height '5\' 8"'$  (1.727 m), weight 228 lb 9.6 oz (103.7 kg), SpO2 97 %.  Vitals with BMI 08/27/2019 06/04/2019 04/03/2019  Height '5\' 8"'$  '5\' 8"'$  '5\' 8"'$   Weight 228 lbs 10 oz 241 lbs 13 oz 245 lbs 2 oz  BMI 34.77 45.03 88.82  Systolic 800 349 179  Diastolic 69 80 73  Pulse 74 91 84     Physical Exam Cardiovascular:     Rate and Rhythm: Normal rate and regular rhythm.     Pulses: Intact distal pulses.          Carotid pulses are on the right side with bruit and on the left side with bruit.    Heart sounds: Murmur  heard.  Harsh midsystolic murmur is present with a grade of 2/6 at the upper right sternal border radiating to the neck.  No gallop.      Comments: No leg edema, no JVD.  Bilateral varicose veins noted in the calf. Pulmonary:     Effort: Pulmonary effort is normal.     Breath sounds: Normal breath sounds.  Abdominal:     General: Bowel sounds are normal.     Palpations: Abdomen is soft.    Laboratory examination:   Recent Labs    04/17/19 1200  NA 142  K 5.6*  CL 104  CO2 23  GLUCOSE 229*  BUN 16  CREATININE 1.16  CALCIUM 9.9  GFRNONAA 65  GFRAA 75   CrCl cannot be calculated (Patient's most recent lab result is older than the maximum 21 days allowed.).  CMP Latest Ref Rng & Units 04/17/2019 08/18/2018 04/21/2010  Glucose 65 - 99 mg/dL 229(H) 302(H) 105(H)  BUN 8 - 27 mg/dL '16 19 11  '$ Creatinine 0.76 - 1.27 mg/dL 1.16 1.03 0.89  Sodium 134 - 144 mmol/L 142 137 138  Potassium 3.5 - 5.2 mmol/L 5.6(H) 4.1 4.0  Chloride 96 - 106 mmol/L 104  106 108  CO2 20 - 29 mmol/L '23 24 25  '$ Calcium 8.6 - 10.2 mg/dL 9.9 9.5 8.1(L)   CBC Latest Ref Rng & Units 08/18/2018 04/21/2010 04/20/2010  WBC 4.0 - 10.5 K/uL 7.3 9.8 13.3(H)  Hemoglobin 13.0 - 17.0 g/dL 14.2 13.0 14.4  Hematocrit 39 - 52 % 42.2 39.3 42.6  Platelets 150 - 400 K/uL 157 138(L) 165   Lipid Panel Recent Labs    07/16/19 1332  CHOL 85*  TRIG 80  LDLCALC 34  HDL 34*    External labs 03/05/2019:   Sodium 140, potassium 4.7, BUN 21, creatinine 1.43, EGFR 50 mL, serum glucose 277.  Hb 14.7/HCT 44.1, platelets 183.  Normal indicis.  HEMOGLOBIN A1C  9.9 (H)   <5.7 %   LDL Direct 87 <130 mg/dL  Total Cholesterol 131 25 - 199 MG/DL  Triglycerides 86 10 - 150 MG/DL  HDL Cholesterol 36 35 - 135 MG/DL  Total Chol / HDL Cholesterol 3.6 <4.5   Non-HDL Cholesterol 95      Medications and allergies   Allergies  Allergen Reactions  . Penicillins Other (See Comments)    Did it involve swelling of the face/tongue/throat,  SOB, or low BP? Unknown Did it involve sudden or severe rash/hives, skin peeling, or any reaction on the inside of your mouth or nose? Unknown Did you need to seek medical attention at a hospital or doctor's office? Unknown When did it last happen?Childhood If all above answers are "NO", may proceed with cephalosporin use.   Marland Kitchen Lisinopril Other (See Comments)    Hyperkalemia at high dose    Current Outpatient Medications  Medication Instructions  . aspirin EC 81 mg, Oral, Daily  . diltiazem (CARDIZEM CD) 240 MG 24 hr capsule TAKE 1 CAPSULE BY MOUTH EVERY DAY  . Januvia 100 mg, Oral, Daily at bedtime  . lisinopril (ZESTRIL) 5 mg, Oral, Daily  . metFORMIN (GLUCOPHAGE-XR) 2,000 mg, Oral, Daily at bedtime  . pioglitazone (ACTOS) 15 MG tablet 1 tablet, Oral, Daily at bedtime  . rosuvastatin (CRESTOR) 20 mg, Oral, Daily   Radiology:  No results found.  Cardiac Studies:   Exercise sestamibi stress test done on 12/22/2011 revealed that patient was able to walk for 8 minutes and achieved 10 metastases. There is no evidence of ischemia. There was mild diaphragmatic attenuation artifact. Overall a low risk stress test.  Carotid duplex that was performed to evaluate carotid bruit on 11/04/2011 revealed mild heterogeneous plaque without significant carotid stenosis.  Echocardiogram 05/01/2019:  Hyperdynamic LV systolic function with visual EF >70%. Left ventricle  cavity is normal in size. Mild left ventricular hypertrophy. Normal global  wall motion. Doppler evidence of grade II diastolic dysfunction, elevated  LAP. Calculated EF 67%.  Left atrial cavity is mildly dilated.  Peak velocity 2.58ms, mean gradient 192mg, Dimensional index 0.5, findings suggestive of aortic sclerosis without stenosis.  Mild mitral annular calcification. Mild mitral regurgitation.  No other significant valvular abnormalities.  No significant change compared to prior study dated 12/17/2011.  EKG:  EKG  03/28/2019: Sinus rhythm with first-degree block with a rate of 79 bpm, normal axis, poor R wave progression, probably normal variant but cannot exclude anteroseptal infarct old.  No evidence of ischemia, otherwise normal EKG.   Assessment     ICD-10-CM   1. Primary hypertension  I10   2. Aortic valve sclerosis  I35.8   3. Hypercholesteremia  E78.00   4. Stage 3a chronic kidney disease  N18.31   5.  Obstructive sleep apnea  G47.33 Ambulatory referral to Pulmonology    No orders of the defined types were placed in this encounter.   There are no discontinued medications.   Recommendations:   Eddie Carpenter  is a 69 y.o. with HTN, HLD, DM with stage 3 CKD, presents for follow-up of aortic stenotic murmur and hypertension.  He was seen by me about 2 months ago and now presents for f/u of hypertension, tachycardia and hyperlipidemia.  His blood pressure is well controlled.  Diltiazem CD at last office visit that he is tolerating, heart rate is also improved from tachycardia.  He has obstructive sleep apnea, using the same CPAP for the past 15 years and has not been.  I will defer to be related to pulmonary medicine for the same.  Lipids under control.  Blood production redemonstrated to the fact that he was taken Crestor and potassium were stopped simultaneously.  He has discontinued statin now.  Is has not had any side effects or does not any myalgias, continue Crestor milligrams daily.  Needs A1c.  Would consider addition of Invokana for DM and weight loss.  He has a follow-up appointment with Bing Matter, PA soon.   Adrian Prows, MD, [facility] 08/27/2019, 10:07 AM [facility]. [place] Office: [phone]

## 2019-08-28 ENCOUNTER — Other Ambulatory Visit: Payer: Self-pay | Admitting: Cardiology

## 2019-08-28 DIAGNOSIS — E7800 Pure hypercholesterolemia, unspecified: Secondary | ICD-10-CM

## 2019-08-29 ENCOUNTER — Other Ambulatory Visit: Payer: Self-pay

## 2019-08-29 DIAGNOSIS — E7800 Pure hypercholesterolemia, unspecified: Secondary | ICD-10-CM

## 2019-08-29 MED ORDER — ROSUVASTATIN CALCIUM 20 MG PO TABS: 20.0000 mg | ORAL_TABLET | Freq: Every day | ORAL | 3 refills | Status: DC

## 2019-09-22 ENCOUNTER — Other Ambulatory Visit: Payer: Self-pay

## 2019-09-22 ENCOUNTER — Ambulatory Visit (INDEPENDENT_AMBULATORY_CARE_PROVIDER_SITE_OTHER): Payer: Medicare Other | Admitting: Internal Medicine

## 2019-09-22 ENCOUNTER — Encounter: Payer: Self-pay | Admitting: Internal Medicine

## 2019-09-22 VITALS — BP 130/70 | HR 78 | Temp 97.3°F | Ht 68.0 in | Wt 223.8 lb

## 2019-09-22 DIAGNOSIS — E669 Obesity, unspecified: Secondary | ICD-10-CM

## 2019-09-22 DIAGNOSIS — G4733 Obstructive sleep apnea (adult) (pediatric): Secondary | ICD-10-CM

## 2019-09-22 NOTE — Patient Instructions (Signed)
Order- schedule home sleep test    Dx OSA  Please call us about 2 weeks after your sleep study to see if results and recommendations are available. If appropriate we may be able to start treatment before we see you next.

## 2019-09-22 NOTE — Progress Notes (Signed)
09/22/19- 69 yoM former smoker for sleep evaluation, referred courtesy of Dr E[facility]. Medical problem list includes OSA,  HTN, HLD, DM, CKD 3, Aortic Stenosis, Hx Prostate Cancer, Hyperlipidemia,  Remote dx OSA, machine is at least 68 years old Body weight today 223 lbs Epworth score 4 He says current CPAP pressure is too high and cannot use. Dry mouth. Admits stressed after laid off from job in April. Attributes 15 lb wt loss to stress.  Denies ENT surgery. Quit smoking 2018- never dx'd lung disease. Sleeping w/o CPAP in recent weeks. Wakes feeling rested after melatonin. Caffeine 2-3 cups in AM.  Had 2 Phizer Covax  Prior to Admission medications   Medication Sig Start Date End Date Taking? Authorizing Provider  aspirin EC 81 MG tablet Take 81 mg by mouth daily.   Yes [provider]  Blood Glucose Monitoring Suppl (ONE TOUCH ULTRA 2) w/Device KIT See admin instructions. 09/16/19  Yes [provider]  diltiazem (CARDIZEM CD) 240 MG 24 hr capsule TAKE 1 CAPSULE BY MOUTH EVERY DAY 07/30/19  Yes Adrian Prows, MD  escitalopram (LEXAPRO) 10 MG tablet Take half pill at night  for one week and increase to a whole pill 09/16/19  Yes [provider]  fluticasone (FLONASE) 50 MCG/ACT nasal spray Place into the nose.   Yes [provider]  JANUVIA 100 MG tablet Take 100 mg by mouth at bedtime.  07/12/18  Yes [provider]  Melatonin 10 MG TABS Take 1 tablet by mouth at bedtime.   Yes [provider]  metFORMIN (GLUCOPHAGE-XR) 500 MG 24 hr tablet Take 2,000 mg by mouth at bedtime. 05/26/18  Yes [provider]  pioglitazone (ACTOS) 15 MG tablet Take 1 tablet by mouth at bedtime.  05/26/18  Yes [provider]  rosuvastatin (CRESTOR) 20 MG tablet Take 1 tablet (20 mg total) by mouth daily. 08/29/19  Yes Adrian Prows, MD  lisinopril (ZESTRIL) 5 MG tablet Take 1 tablet (5 mg total) by mouth daily. 04/18/19 08/27/19  Adrian Prows, MD   Past  Medical History:  Diagnosis Date  . Diabetes mellitus without complication ([place])   . Hyperlipidemia   . Prostate cancer [facility])    Past Surgical History:  Procedure Laterality Date  . I & D EXTREMITY Right 08/18/2018   Procedure: IRRIGATION AND DEBRIDEMENT RIGHT ARM AND REPAIR OF LACERATION;  Surgeon: Renette Butters, MD;  Location: [place];  Service: Orthopedics;  Laterality: Right;   Family History  Problem Relation Age of Onset  . Heart attack Mother   . Dementia Father   . Obesity Sister   . Obesity Sister    Social History   Socioeconomic History  . Marital status: Married    Spouse name: Not on file  . Number of children: 1  . Years of education: Not on file  . Highest education level: Not on file  Occupational History  . Not on file  Tobacco Use  . Smoking status: Former Smoker    Types: Cigarettes    Start date: 2018  . Smokeless tobacco: Never Used  Vaping Use  . Vaping Use: Never used  Substance and Sexual Activity  . Alcohol use: Never  . Drug use: Never  . Sexual activity: Not on file  Other Topics Concern  . Not on file  Social History Narrative  . Not on file   Social Determinants of Health   Financial Resource Strain:   . Difficulty of Paying Living Expenses:  Food Insecurity:   . Worried About Charity fundraiser in the Last Year:   . Arboriculturist in the Last Year:   Transportation Needs:   . Film/video editor (Medical):   Marland Kitchen Lack of Transportation (Non-Medical):   Physical Activity:   . Days of Exercise per Week:   . Minutes of Exercise per Session:   Stress:   . Feeling of Stress :   Social Connections:   . Frequency of Communication with Friends and Family:   . Frequency of Social Gatherings with Friends and Family:   . Attends Religious Services:   . Active Member of Clubs or Organizations:   . Attends Archivist Meetings:   Marland Kitchen Marital Status:   Intimate Partner Violence:   . Fear of Current or Ex-Partner:   .  Emotionally Abused:   Marland Kitchen Physically Abused:   . Sexually Abused:    ROS-see HPI   + = positive Constitutional:    weight loss, night sweats, fevers, chills, fatigue, lassitude. HEENT:    headaches, difficulty swallowing, tooth/dental problems, sore throat,       sneezing, itching, ear ache, nasal congestion, post nasal drip, snoring CV:    chest pain, orthopnea, PND, swelling in lower extremities, anasarca,                                   dizziness, palpitations Resp:   shortness of breath with exertion or at rest.                productive cough,   non-productive cough, coughing up of blood.              change in color of mucus.  wheezing.   Skin:    rash or lesions. GI:  No-   heartburn, indigestion, abdominal pain, nausea, vomiting, diarrhea,                 change in bowel habits, loss of appetite GU: dysuria, change in color of urine, no urgency or frequency.   flank pain. MS:   joint pain, stiffness, decreased range of motion, back pain. Neuro-     nothing unusual Psych:  change in mood or affect.  depression or anxiety.   memory loss.  OBJ- Physical Exam General- Alert, Oriented, Affect-appropriate, Distress- none acute, + mild overweight Skin- rash-none, lesions- none, excoriation- none Lymphadenopathy- none Head- atraumatic            Eyes- Gross vision intact, PERRLA, conjunctivae and secretions clear            Ears- Hearing, canals-normal            Nose- Clear, no-Septal dev, mucus, polyps, erosion, perforation             Throat- Mallampati II , mucosa clear , drainage- none, tonsils- atrophic, + teeth Neck- flexible , trachea midline, no stridor , thyroid nl, carotid no bruit Chest - symmetrical excursion , unlabored           Heart/CV- RRR ,  murmur + 2/6 AS, no gallop  , no rub, nl s1 s2                           - JVD- none , edema- none, stasis changes- none, varices- none           Lung- clear  to P&A, wheeze- none, cough- none , dullness-none, rub- none            Chest wall-  Abd-  Br/ Gen/ Rectal- Not done, not indicated Extrem- cyanosis- none, clubbing, none, atrophy- none, strength- nl Neuro- grossly intact to observation

## 2019-09-29 ENCOUNTER — Telehealth: Payer: Self-pay | Admitting: Internal Medicine

## 2019-09-29 DIAGNOSIS — E669 Obesity, unspecified: Secondary | ICD-10-CM | POA: Insufficient documentation

## 2019-09-29 DIAGNOSIS — G4733 Obstructive sleep apnea (adult) (pediatric): Secondary | ICD-10-CM | POA: Insufficient documentation

## 2019-09-29 NOTE — Telephone Encounter (Signed)
Called and spoke with patient, he stated that he is allergic to PCN, but is unsure how Lisinopril ended up on his allergy list, he has been taking it for years with no problem, no cough.  List updated, allergy to Lisinopril allergy deleted.  Nothing further needed.

## 2019-09-29 NOTE — Assessment & Plan Note (Signed)
He has lost some weight, blaming stress of job loss. He would be better off if he lost more. We discussed weight impact on OSA.

## 2019-09-29 NOTE — Assessment & Plan Note (Signed)
Original sleep study was 15-20 years ago. He has been sleeping without CPAP recently and reports weight loss. Plan- update sleep study, then possibly CPAP but we have discussed alternatives.

## 2019-10-03 ENCOUNTER — Ambulatory Visit: Payer: Medicare Other

## 2019-10-03 ENCOUNTER — Other Ambulatory Visit: Payer: Self-pay

## 2019-10-03 DIAGNOSIS — G4733 Obstructive sleep apnea (adult) (pediatric): Secondary | ICD-10-CM

## 2019-10-22 DIAGNOSIS — G4733 Obstructive sleep apnea (adult) (pediatric): Secondary | ICD-10-CM | POA: Diagnosis not present

## 2019-10-28 ENCOUNTER — Telehealth: Payer: Self-pay | Admitting: Internal Medicine

## 2019-10-28 DIAGNOSIS — G4733 Obstructive sleep apnea (adult) (pediatric): Secondary | ICD-10-CM

## 2019-10-28 NOTE — Telephone Encounter (Signed)
Spoke with pt and notified of results per Dr. Annamaria Boots. Pt verbalized understanding and denied any questions. Order sent to [facility] for new CPAP and appt scheduled for f/u with Dr Annamaria Boots.

## 2019-10-28 NOTE — Telephone Encounter (Signed)
Sleep study shows severe sleep apnea, averaging 69 apneas/ hour, with drops in blood oxygen level.  Recommend new DME, new CPAP auto 5-20, mask of choice, humidifier, supplies. AirView/ card  He will need return ov in 31-90 days please

## 2019-10-28 NOTE — Telephone Encounter (Signed)
Called and spoke with patient to let him know that message about HST results was going to be sent to Dr. Annamaria Boots and that we would call him back with his recommendations.  Dr. Annamaria Boots please advise

## 2019-12-28 NOTE — Progress Notes (Deleted)
09/22/19- 69 yoM former smoker for sleep evaluation, referred courtesy of Dr [facility]. Medical problem list includes OSA,  HTN, HLD, DM, CKD 3, Aortic Stenosis, Hx Prostate Cancer, Hyperlipidemia,  Remote dx OSA, machine is at least 68 years old Body weight today 223 lbs Epworth score 4 He says current CPAP pressure is too high and cannot use. Dry mouth. Admits stressed after laid off from job in April. Attributes 15 lb wt loss to stress.  Denies ENT surgery. Quit smoking 2018- never dx'd lung disease. Sleeping w/o CPAP in recent weeks. Wakes feeling rested after melatonin. Caffeine 2-3 cups in AM.  Had 2 Phizer Covax  12/29/19- 69 yoM former smoker followed for OSA, complicated by HTN, HLD, DM, CKD 3, Aortic Stenosis, Hx Prostate Cancer, Hyperlipidemia, HST 10/04/19- AHI 69.1/ hr, desaturation to 84%, body weight 223 lbs CPAP 5-20/ [facility]- old machine replaced 10/28/19 Download- Body weight today- Covid vax- Flu vax-    ROS-see HPI   + = positive Constitutional:    weight loss, night sweats, fevers, chills, fatigue, lassitude. HEENT:    headaches, difficulty swallowing, tooth/dental problems, sore throat,       sneezing, itching, ear ache, nasal congestion, post nasal drip, snoring CV:    chest pain, orthopnea, PND, swelling in lower extremities, anasarca,                                   dizziness, palpitations Resp:   shortness of breath with exertion or at rest.                productive cough,   non-productive cough, coughing up of blood.              change in color of mucus.  wheezing.   Skin:    rash or lesions. GI:  No-   heartburn, indigestion, abdominal pain, nausea, vomiting, diarrhea,                 change in bowel habits, loss of appetite GU: dysuria, change in color of urine, no urgency or frequency.   flank pain. MS:   joint pain, stiffness, decreased range of motion, back pain. Neuro-     nothing unusual Psych:  change in mood or affect.  depression or  anxiety.   memory loss.  OBJ- Physical Exam General- Alert, Oriented, Affect-appropriate, Distress- none acute, + mild overweight Skin- rash-none, lesions- none, excoriation- none Lymphadenopathy- none Head- atraumatic            Eyes- Gross vision intact, PERRLA, conjunctivae and secretions clear            Ears- Hearing, canals-normal            Nose- Clear, no-Septal dev, mucus, polyps, erosion, perforation             Throat- Mallampati II , mucosa clear , drainage- none, tonsils- atrophic, + teeth Neck- flexible , trachea midline, no stridor , thyroid nl, carotid no bruit Chest - symmetrical excursion , unlabored           Heart/CV- RRR ,  murmur + 2/6 AS, no gallop  , no rub, nl s1 s2                           - JVD- none , edema- none, stasis changes- none, varices- none  Lung- clear to P&A, wheeze- none, cough- none , dullness-none, rub- none           Chest wall-  Abd-  Br/ Gen/ Rectal- Not done, not indicated Extrem- cyanosis- none, clubbing, none, atrophy- none, strength- nl Neuro- grossly intact to observation

## 2019-12-29 ENCOUNTER — Ambulatory Visit: Payer: No Typology Code available for payment source | Admitting: Internal Medicine

## 2020-01-13 ENCOUNTER — Other Ambulatory Visit: Payer: Self-pay | Admitting: Cardiology

## 2020-01-13 DIAGNOSIS — I10 Essential (primary) hypertension: Secondary | ICD-10-CM

## 2020-01-21 ENCOUNTER — Ambulatory Visit: Payer: No Typology Code available for payment source | Admitting: Internal Medicine

## 2020-03-26 NOTE — Progress Notes (Signed)
HPI M former smoker followed for OSA, complicated by   HTN, HLD, DM2, CKD 3, Aortic Stenosis, Hx Prostate Cancer, Hyperlipidemia, HST 10/04/19- AHI 69.1/ hr, desaturation to 84%, body weight 223 lbs  =========================================================  09/22/19- 69 yoM former smoker for sleep evaluation, referred courtesy of Dr [facility]. Medical problem list includes OSA,  HTN, HLD, DM, CKD 3, Aortic Stenosis, Hx Prostate Cancer, Hyperlipidemia,  Remote dx OSA, machine is at least 69 years old Body weight today 223 lbs Epworth score 4 He says current CPAP pressure is too high and cannot use. Dry mouth. Admits stressed after laid off from job in April. Attributes 15 lb wt loss to stress.  Denies ENT surgery. Quit smoking 2018- never dx'd lung disease. Sleeping w/o CPAP in recent weeks. Wakes feeling rested after melatonin. Caffeine 2-3 cups in AM.  Had 2 Phizer Covax  03/29/20- 69 yoM former smoker followed for OSA, complicated by   HTN, HLD, DM2, CKD 3, Aortic Stenosis, Hx Prostate Cancer, Hyperlipidemia, HST 10/04/19- AHI 69.1/ hr, desaturation to 84%, body weight 223 lbs CPAP auto 5-20/ [facility]     Replacement machine ordered 10/28/19 Download- compliance 100%, AHI 9.6/ hr            Pressure range 8.6-12.3 Body weight today-248 lbs Covid vax- 3 Phizer Flu vax-had Mostly central apneas, not concerned. Download reviewed.  Machine seems to blow hot air. He doesn't bother with humidifier, which might help. Sleeping ok. Denies interval health concerns.  ROS-see HPI   + = positive Constitutional:    weight loss, night sweats, fevers, chills, fatigue, lassitude. HEENT:    headaches, difficulty swallowing, tooth/dental problems, sore throat,       sneezing, itching, ear ache, nasal congestion, post nasal drip, snoring CV:    chest pain, orthopnea, PND, swelling in lower extremities, anasarca,                                  dizziness, palpitations Resp:   shortness of breath with  exertion or at rest.                productive cough,   non-productive cough, coughing up of blood.              change in color of mucus.  wheezing.   Skin:    rash or lesions. GI:  No-   heartburn, indigestion, abdominal pain, nausea, vomiting, diarrhea,                 change in bowel habits, loss of appetite GU: dysuria, change in color of urine, no urgency or frequency.   flank pain. MS:   joint pain, stiffness, decreased range of motion, back pain. Neuro-     nothing unusual Psych:  change in mood or affect.  depression or anxiety.   memory loss.  OBJ- Physical Exam General- Alert, Oriented, Affect-appropriate, Distress- none acute, + overweight Skin- rash-none, lesions- none, excoriation- none Lymphadenopathy- none Head- atraumatic            Eyes- Gross vision intact, PERRLA, conjunctivae and secretions clear            Ears- Hearing, canals-normal            Nose- Clear, no-Septal dev, mucus, polyps, erosion, perforation             Throat- Mallampati II , mucosa clear , drainage- none, tonsils- atrophic , +  teeth Neck- flexible , trachea midline, no stridor , thyroid nl, carotid no bruit Chest - symmetrical excursion , unlabored           Heart/CV- RRR ,  murmur + 2/6 AS, no gallop  , no rub, nl s1 s2                           - JVD- none , edema- none, stasis changes- none, varices- none           Lung- clear to P&A, wheeze- none, cough- none , dullness-none, rub- none           Chest wall-  Abd-  Br/ Gen/ Rectal- Not done, not indicated Extrem- cyanosis- none, clubbing, none, atrophy- none, strength- nl Neuro- grossly intact to observation

## 2020-03-29 ENCOUNTER — Other Ambulatory Visit: Payer: Self-pay

## 2020-03-29 ENCOUNTER — Ambulatory Visit (INDEPENDENT_AMBULATORY_CARE_PROVIDER_SITE_OTHER): Payer: Medicare Other | Admitting: Internal Medicine

## 2020-03-29 ENCOUNTER — Encounter: Payer: Self-pay | Admitting: Internal Medicine

## 2020-03-29 VITALS — BP 118/72 | HR 68 | Temp 97.0°F | Ht 68.0 in | Wt 248.6 lb

## 2020-03-29 DIAGNOSIS — E669 Obesity, unspecified: Secondary | ICD-10-CM

## 2020-03-29 DIAGNOSIS — G4733 Obstructive sleep apnea (adult) (pediatric): Secondary | ICD-10-CM

## 2020-03-29 NOTE — Assessment & Plan Note (Signed)
Benefits from CPAP with good compliance and adequate control. Don't expect CPAP to affect centrals. Plan- To help with "hot air" complaint and noise, will ask DME to service machine and reduce auto range to 5-15.

## 2020-03-29 NOTE — Patient Instructions (Signed)
Order- DME [facility] Romans- please help patient reduce temperature of his CPAP machine. Change auto range to 5-15. Continue mask of choice, humidifier, supplies, AirView/ card  Please call if we can help

## 2020-03-29 NOTE — Assessment & Plan Note (Signed)
Recorded weight today with clothing is up 25 lbs from weight recorded with HST 6 months ago. He is obese. Strongly encourage diet and exercise. Consider referral to [facility] program.

## 2020-08-11 ENCOUNTER — Other Ambulatory Visit: Payer: Self-pay

## 2020-08-11 DIAGNOSIS — I10 Essential (primary) hypertension: Secondary | ICD-10-CM

## 2020-08-11 MED ORDER — DILTIAZEM HCL ER COATED BEADS 240 MG PO CP24: 240.0000 mg | ORAL_CAPSULE | Freq: Every day | ORAL | 0 refills | Status: DC

## 2020-08-27 ENCOUNTER — Encounter: Payer: Self-pay | Admitting: Cardiology

## 2020-08-27 ENCOUNTER — Ambulatory Visit: Payer: Medicare Other | Admitting: Cardiology

## 2020-08-27 ENCOUNTER — Other Ambulatory Visit: Payer: Self-pay

## 2020-08-27 VITALS — BP 131/73 | HR 75 | Temp 97.7°F | Resp 16 | Ht 68.0 in | Wt 247.0 lb

## 2020-08-27 DIAGNOSIS — R0989 Other specified symptoms and signs involving the circulatory and respiratory systems: Secondary | ICD-10-CM

## 2020-08-27 DIAGNOSIS — G4733 Obstructive sleep apnea (adult) (pediatric): Secondary | ICD-10-CM

## 2020-08-27 DIAGNOSIS — I440 Atrioventricular block, first degree: Secondary | ICD-10-CM | POA: Insufficient documentation

## 2020-08-27 DIAGNOSIS — E785 Hyperlipidemia, unspecified: Secondary | ICD-10-CM | POA: Insufficient documentation

## 2020-08-27 DIAGNOSIS — E1165 Type 2 diabetes mellitus with hyperglycemia: Secondary | ICD-10-CM

## 2020-08-27 DIAGNOSIS — E7800 Pure hypercholesterolemia, unspecified: Secondary | ICD-10-CM

## 2020-08-27 DIAGNOSIS — I10 Essential (primary) hypertension: Secondary | ICD-10-CM

## 2020-08-27 MED ORDER — DILTIAZEM HCL ER COATED BEADS 240 MG PO CP24: 240.0000 mg | ORAL_CAPSULE | Freq: Every day | ORAL | 3 refills | Status: DC

## 2020-08-27 NOTE — Progress Notes (Signed)
Primary Physician/Referring:  Aletha Halim., PA-C  Patient ID: Eddie Carpenter, male    DOB: [birth year], 70 y.o.   MRN: [id number]  Chief Complaint  Patient presents with  . Hypertension  . Hyperlipidemia  . Follow-up   HPI:    Eddie Carpenter  is a 70 y.o. with HTN, HLD, DM, mild bilateral carotid atherosclerosis and aortic sclerosis, OSA on CPAP presents for annual visit.  He is presently asymptomatic, but does admit to lack of physical activity.  He has been playing golf without any chest pain or dyspnea.  Diabetes continues to be uncontrolled.  Past Medical History:  Diagnosis Date  . Diabetes mellitus without complication ([place])   . Dog bite of right upper extremity 08/18/2018  . Hyperlipidemia   . Prostate cancer [facility])    Past Surgical History:  Procedure Laterality Date  . I & D EXTREMITY Right 08/18/2018   Procedure: IRRIGATION AND DEBRIDEMENT RIGHT ARM AND REPAIR OF LACERATION;  Surgeon: Renette Butters, MD;  Location: [employer];  Service: Orthopedics;  Laterality: Right;   Social History   Tobacco Use  . Smoking status: Former    Pack years: 0.00    Types: Cigarettes    Start date: 2018  . Smokeless tobacco: Never  Substance Use Topics  . Alcohol use: Never  Marital Status: Married   ROS  Review of Systems  Cardiovascular:  Negative for dyspnea on exertion and leg swelling.  Gastrointestinal:  Negative for melena.  Objective  Blood pressure 131/73, pulse 75, temperature 97.7 F (36.5 C), temperature source Temporal, resp. rate 16, height $RemoveBe'5\' 8"'eQEcQAVjn$  (1.727 m), weight 247 lb (112 kg), SpO2 98 %.  Vitals with BMI 08/27/2020 03/29/2020 09/22/2019  Height $Remov'5\' 8"'GddLey$  $Remove'5\' 8"'yxeMNDX$  $RemoveB'5\' 8"'EwgTwhYQ$   Weight 247 lbs 248 lbs 10 oz 223 lbs 13 oz  BMI 37.56 85.02 77.41  Systolic 287 867 672  Diastolic 73 72 70  Pulse 75 68 78     Physical Exam Neck:     Vascular: Carotid bruit present. No JVD.  Cardiovascular:     Rate and Rhythm: Normal rate and regular rhythm.     Pulses:           Dorsalis pedis pulses are 1+ on the right side and 1+ on the left side.       Posterior tibial pulses are 0 on the right side and 0 on the left side.     Heart sounds: Murmur heard.  Harsh midsystolic murmur is present with a grade of 2/6 at the upper right sternal border radiating to the neck.    No gallop.  Pulmonary:     Effort: Pulmonary effort is normal.     Breath sounds: Normal breath sounds.  Abdominal:     General: Bowel sounds are normal.     Palpations: Abdomen is soft.  Musculoskeletal:        General: No swelling.     Comments: Bilateral varicose veins noted in the calf.   Laboratory examination:   External labs   Labs 07/30/2020:  Sodium 138, potassium 4.7, BUN 19, creatinine 1.03, EGFR >60 mL, serum glucose 214 mg.  A1c 8.7%.  Total cholesterol 95, triglycerides 83, HDL 34, LDL 54.  Hb 12.6 Medications and allergies   Allergies  Allergen Reactions  . Penicillins Other (See Comments)    Did it involve swelling of the face/tongue/throat, SOB, or low BP? Unknown Did it involve sudden or severe rash/hives, skin peeling, or any reaction  on the inside of your mouth or nose? Unknown Did you need to seek medical attention at a hospital or doctor's office? Unknown When did it last happen?      Childhood If all above answers are "NO", may proceed with cephalosporin use.     Current Outpatient Medications  Medication Instructions  . aspirin EC 81 mg, Oral, Daily  . Blood Glucose Monitoring Suppl (ONE TOUCH ULTRA 2) w/Device KIT See admin instructions  . diltiazem (CARDIZEM CD) 240 mg, Oral, Daily  . escitalopram (LEXAPRO) 10 MG tablet Take half pill at night  for one week and increase to a whole pill  . fluticasone (FLONASE) 50 MCG/ACT nasal spray Nasal  . Januvia 100 mg, Oral, Daily at bedtime  . lisinopril (ZESTRIL) 5 mg, Oral, Daily  . Melatonin 10 MG TABS 1 tablet, Oral, Daily at bedtime  . metFORMIN (GLUCOPHAGE-XR) 2,000 mg, Oral, Daily at bedtime  .  pioglitazone (ACTOS) 30 MG tablet 1 tablet, Oral, Daily at bedtime  . rosuvastatin (CRESTOR) 20 mg, Oral, Daily   Radiology:  No results found.  Cardiac Studies:   Exercise sestamibi stress test done on 12/22/2011 revealed that patient was able to walk for 8 minutes and achieved 10 metastases. There is no evidence of ischemia. There was mild diaphragmatic attenuation artifact. Overall a low risk stress test.  Carotid duplex that was performed to evaluate carotid bruit on 11/04/2011 revealed mild heterogeneous plaque without significant carotid stenosis.  Echocardiogram 05/01/2019:  Hyperdynamic LV systolic function with visual EF >70%. Left ventricle  cavity is normal in size. Mild left ventricular hypertrophy. Normal global  wall motion. Doppler evidence of grade II diastolic dysfunction, elevated  LAP. Calculated EF 67%.  Left atrial cavity is mildly dilated.  Peak velocity 2.64m/s, mean gradient 41mmHg, Dimensional index 0.5, findings suggestive of aortic sclerosis without stenosis.  Mild mitral annular calcification. Mild mitral regurgitation.  No other significant valvular abnormalities.  No significant change compared to prior study dated 12/17/2011.  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  Otherwise normal QRS and T waves.  EKG 08/27/2020, repeated due to AV block: Sinus rhythm with first-degree block at rate of 69 bpm, normal axis.  Single PVC.  EKG 03/28/2019: Sinus rhythm with first-degree block with a rate of 79 bpm, normal axis, poor R wave progression, probably normal variant but cannot exclude anteroseptal infarct old.  No evidence of ischemia, otherwise normal EKG.   Assessment     ICD-10-CM   1. Primary hypertension  I10 EKG 12-Lead    diltiazem (CARDIZEM CD) 240 MG 24 hr capsule    2. 1st degree AV block  I44.0      3. Obstructive sleep apnea  G47.33     4. Type 2 diabetes mellitus with hyperglycemia, without long-term current use of insulin (HCC)  E11.65     5. Pure hypercholesterolemia  E78.00     6. Bilateral carotid bruits  R09.89 PCV CAROTID DUPLEX (BILATERAL)    7. Hypercholesteremia  E78.00       Meds ordered this encounter  Medications  . diltiazem (CARDIZEM CD) 240 MG 24 hr capsule    Sig: Take 1 capsule (240 mg total) by mouth daily.    Dispense:  90 capsule    Refill:  3    Patient needs an appointment to receive anymore  refills.     Medications Discontinued During This Encounter  Medication Reason  . diltiazem (CARDIZEM CD) 240 MG 24 hr capsule Reorder     Recommendations:   Eddie Carpenter  is a 70 y.o. with HTN, HLD, DM, mild bilateral carotid atherosclerosis and aortic sclerosis, OSA on CPAP presents for annual visit.  He is presently asymptomatic although he has sedentary lifestyle.  I have discussed regarding primary prevention especially with regard to uncontrolled diabetes mellitus.  Recently the Actos medication has been increased from 15 to 30 mg.  Victoza may be a good choice for weight loss as well and I will request him to follow-up with his PCP regarding this.  I reviewed his progress with regard to lipids, excellent control.  Continue Crestor 20 mg daily.  Blood pressure is also well controlled and he is also on a low-dose of an ACE inhibitor for cardiovascular and renal protection in view of diabetes mellitus.  No changes were done with regard to this.  Carotid artery duplex need to be performed, he does have prominent carotid bruit.  Vascular examination otherwise is unremarkable.  I advised him and encouraged him to join a exercise program.  I will see him back in a year for follow-up, on the EKG revealing first-degree AV block, there was suggestion of a Mobitz 2 AV block but he remains completely asymptomatic without dizziness or syncope.  Suspect it was  probably a nonconducted single beat and we can continue to watch this for now.  Repeat EKG just essentially reveals sinus rhythm with first-degree AV block.  40-minute office visit encounter in review of external records, discussions regarding and counseling regarding diabetes mellitus and weight loss.   Adrian Prows, MD, [facility] 08/27/2020, 10:58 AM Office: [phone] Fax: [phone] Pager: [phone]

## 2020-09-11 ENCOUNTER — Other Ambulatory Visit: Payer: Self-pay | Admitting: Cardiology

## 2020-09-11 DIAGNOSIS — E7800 Pure hypercholesterolemia, unspecified: Secondary | ICD-10-CM

## 2020-09-14 ENCOUNTER — Other Ambulatory Visit: Payer: Self-pay

## 2020-09-14 ENCOUNTER — Ambulatory Visit: Payer: Medicare Other

## 2020-09-14 DIAGNOSIS — I6523 Occlusion and stenosis of bilateral carotid arteries: Secondary | ICD-10-CM

## 2020-09-14 DIAGNOSIS — R0989 Other specified symptoms and signs involving the circulatory and respiratory systems: Secondary | ICD-10-CM

## 2021-03-27 ENCOUNTER — Encounter: Payer: Self-pay | Admitting: Internal Medicine

## 2021-03-28 NOTE — Progress Notes (Signed)
HPI M former smoker followed for OSA, complicated by   HTN, HLD, DM2, CKD 3, Aortic Stenosis, Hx Prostate Cancer, Hyperlipidemia, HST 10/04/19- AHI 69.1/ hr, desaturation to 84%, body weight 223 lbs  =========================================================   03/29/20- 69 yoM former smoker followed for OSA, complicated by   HTN, HLD, DM2, CKD 3, Aortic Stenosis, Hx Prostate Cancer, Hyperlipidemia, HST 10/04/19- AHI 69.1/ hr, desaturation to 84%, body weight 223 lbs CPAP auto 5-20/ [facility]     Replacement machine ordered 10/28/19 Download- compliance 100%, AHI 9.6/ hr            Pressure range 8.6-12.3 Body weight today-248 lbs Covid vax- 3 Phizer Flu vax-had Mostly central apneas, not concerned. Download reviewed.  Machine seems to blow hot air. He doesn't bother with humidifier, which might help. Sleeping ok. Denies interval health concerns.  03/29/21- 70 yoM former smoker followed for OSA, complicated by   HTN, HLD, DM2, CKD 3, Aortic Stenosis, Hx Prostate Cancer, Hyperlipidemia, HST 10/04/19- AHI 69.1/ hr, desaturation to 84%, body weight 223 lbs CPAP auto 5-15/ [facility]     Replacement machine ordered 10/28/19 Download- compliance   97%, AHI 19.9    Mostly Centrals       Body weight today-253 lbs Covid vax- 3 Phizer Flu vax-had Pressure ranging 9.5-13.8            Modest leak -----Patient doing good no concerns He has been using CPAP since 1990.  Current machine replaced 2021.  Download reviewed noting significant residual AHI, predominantly central apneas.  Machine is not pressure limiting so I do not think a pressure change would make a difference.  Modest leak.  He does not use a humidifier.  He does wish airflow were cooler and I have asked him to contact the DME company for advice.  He feels he sleeps well and is comfortable with CPAP.  Nasal mask. Some nasal stuffiness for which he will resume Flonase.  Flu vax-had ROS-see HPI   + = positive Constitutional:    weight loss, night sweats,  fevers, chills, fatigue, lassitude. HEENT:    headaches, difficulty swallowing, tooth/dental problems, sore throat,       sneezing, itching, ear ache, nasal congestion, post nasal drip, snoring CV:    chest pain, orthopnea, PND, swelling in lower extremities, anasarca,                                   dizziness, palpitations Resp:   shortness of breath with exertion or at rest.                productive cough,   non-productive cough, coughing up of blood.              change in color of mucus.  wheezing.   Skin:    rash or lesions. GI:  No-   heartburn, indigestion, abdominal pain, nausea, vomiting, diarrhea,                 change in bowel habits, loss of appetite GU: dysuria, change in color of urine, no urgency or frequency.   flank pain. MS:   joint pain, stiffness, decreased range of motion, back pain. Neuro-     nothing unusual Psych:  change in mood or affect.  depression or anxiety.   memory loss.  OBJ- Physical Exam General- Alert, Oriented, Affect-appropriate, Distress- none acute, + overweight Skin- rash-none, lesions- none, excoriation- none Lymphadenopathy-  none Head- atraumatic            Eyes- Gross vision intact, PERRLA, conjunctivae and secretions clear            Ears- Hearing, canals-normal            Nose- Clear, no-Septal dev, mucus, polyps, erosion, perforation             Throat- Mallampati II , mucosa clear , drainage- none, tonsils- atrophic , + teeth Neck- flexible , trachea midline, no stridor , thyroid nl, carotid no bruit Chest - symmetrical excursion , unlabored           Heart/CV- RRR ,  murmur + 2/6 AS, no gallop  , no rub, nl s1 s2                           - JVD- none , edema- none, stasis changes- none, varices- none           Lung- clear to P&A, wheeze- none, cough- none , dullness-none, rub- none           Chest wall-  Abd-  Br/ Gen/ Rectal- Not done, not indicated Extrem- cyanosis- none, clubbing, none, atrophy- none, strength- nl Neuro-  grossly intact to observation

## 2021-03-29 ENCOUNTER — Other Ambulatory Visit: Payer: Self-pay

## 2021-03-29 ENCOUNTER — Ambulatory Visit (INDEPENDENT_AMBULATORY_CARE_PROVIDER_SITE_OTHER): Payer: Medicare Other | Admitting: Internal Medicine

## 2021-03-29 ENCOUNTER — Encounter: Payer: Self-pay | Admitting: Internal Medicine

## 2021-03-29 DIAGNOSIS — G4733 Obstructive sleep apnea (adult) (pediatric): Secondary | ICD-10-CM

## 2021-03-29 DIAGNOSIS — E669 Obesity, unspecified: Secondary | ICD-10-CM

## 2021-03-29 NOTE — Patient Instructions (Signed)
Ok to continue CPAP auto 5-15  We will see if restarting Flonase helps your stuffy nose  Please call if we can help

## 2021-03-30 NOTE — Assessment & Plan Note (Signed)
Benefits from CPAP with good compliance.  Breakthrough AHI (central greater than obstructive) is higher than desired, but I do not think a pressure change will help. Plan-recheck at next visit.  Meanwhile he will discuss air temperature of his CPAP machine with [facility].

## 2021-03-30 NOTE — Assessment & Plan Note (Signed)
Encouraging ongoing attention to lifestyle, diet, exercise.

## 2021-08-18 ENCOUNTER — Ambulatory Visit: Payer: Medicare Other

## 2021-08-18 DIAGNOSIS — R0989 Other specified symptoms and signs involving the circulatory and respiratory systems: Secondary | ICD-10-CM

## 2021-08-18 DIAGNOSIS — I6523 Occlusion and stenosis of bilateral carotid arteries: Secondary | ICD-10-CM

## 2021-08-31 ENCOUNTER — Encounter: Payer: Self-pay | Admitting: Cardiology

## 2021-08-31 ENCOUNTER — Ambulatory Visit: Payer: Medicare Other | Admitting: Cardiology

## 2021-08-31 VITALS — BP 151/68 | HR 80 | Temp 97.7°F | Resp 16 | Ht 68.0 in | Wt 264.0 lb

## 2021-08-31 DIAGNOSIS — I10 Essential (primary) hypertension: Secondary | ICD-10-CM

## 2021-08-31 DIAGNOSIS — I358 Other nonrheumatic aortic valve disorders: Secondary | ICD-10-CM

## 2021-08-31 DIAGNOSIS — E1129 Type 2 diabetes mellitus with other diabetic kidney complication: Secondary | ICD-10-CM

## 2021-08-31 DIAGNOSIS — E7800 Pure hypercholesterolemia, unspecified: Secondary | ICD-10-CM

## 2021-08-31 DIAGNOSIS — R0989 Other specified symptoms and signs involving the circulatory and respiratory systems: Secondary | ICD-10-CM

## 2021-08-31 MED ORDER — CHLORTHALIDONE 25 MG PO TABS: 25.0000 mg | ORAL_TABLET | ORAL | 3 refills | Status: DC

## 2021-08-31 MED ORDER — KERENDIA 10 MG PO TABS
1.0000 | ORAL_TABLET | Freq: Every day | ORAL | 2 refills | Status: DC
Start: 2021-08-31 — End: 2021-11-03

## 2021-08-31 MED ORDER — LISINOPRIL 10 MG PO TABS: 10.0000 mg | ORAL_TABLET | Freq: Every day | ORAL | 3 refills | Status: DC

## 2021-08-31 NOTE — Progress Notes (Signed)
Primary Physician/Referring:  Richmond Campbell., PA-C  Patient ID: Eddie Carpenter, male    DOB: 1950-03-09, 71 y.o.   MRN: [id number]  Chief Complaint  Patient presents with   Hypertension   Hyperlipidemia   Bilateral carotid artery bruit   first-degree AV block   Follow-up    1 year   HPI:    Eddie Carpenter  is a 71 y.o. with HTN, HLD, DM, mild bilateral carotid atherosclerosis without high-grade stenosis,  OSA on CPAP presents for annual visit.  He is presently asymptomatic although he has sedentary lifestyle.    Past Medical History:  Diagnosis Date   Coronary artery disease    Diabetes mellitus without complication (HCC)    Dog bite of right upper extremity 08/18/2018   Hyperlipidemia    Hypertension    Prostate cancer [facility])    Past Surgical History:  Procedure Laterality Date   I & D EXTREMITY Right 08/18/2018   Procedure: IRRIGATION AND DEBRIDEMENT RIGHT ARM AND REPAIR OF LACERATION;  Surgeon: Sheral Apley, MD;  Location: [facility] OR;  Service: Orthopedics;  Laterality: Right;   Social History   Tobacco Use   Smoking status: Former    Packs/day: 1.00    Years: 40.00    Total pack years: 40.00    Types: Cigarettes    Start date: 2018    Quit date: 02/20/2017    Years since quitting: 4.5   Smokeless tobacco: Never  Substance Use Topics   Alcohol use: Never  Marital Status: Married   ROS  Review of Systems  Cardiovascular:  Negative for dyspnea on exertion and leg swelling.  Gastrointestinal:  Negative for melena.   Objective  Blood pressure (!) 151/68, pulse 80, temperature 97.7 F (36.5 C), temperature source Temporal, resp. rate 16, height 5\' 8"  (1.727 m), weight 264 lb (119.7 kg), SpO2 96 %.     08/31/2021   10:05 AM 08/31/2021    9:59 AM 03/29/2021   11:07 AM  Vitals with BMI  Height  5\' 8"  5\' 8"   Weight  264 lbs 253 lbs 10 oz  BMI  40.15 38.57  Systolic 151 183 05/27/2021  Diastolic 68 76 60  Pulse 80 81 73     Physical Exam Neck:     Vascular:  Carotid bruit present. No JVD.  Cardiovascular:     Rate and Rhythm: Normal rate and regular rhythm.     Pulses:          Dorsalis pedis pulses are 1+ on the right side and 1+ on the left side.       Posterior tibial pulses are 0 on the right side and 0 on the left side.     Heart sounds: Murmur heard.     Harsh midsystolic murmur is present with a grade of 2/6 at the upper right sternal border radiating to the neck.     No gallop.  Pulmonary:     Effort: Pulmonary effort is normal.     Breath sounds: Normal breath sounds.  Abdominal:     General: Bowel sounds are normal.     Palpations: Abdomen is soft.  Musculoskeletal:        General: No swelling.     Comments: Bilateral varicose veins noted in the calf.    Laboratory examination:   External labs   Labs 07/28/2021:  Serum glucose 252.  BUN 22, serum creatinine 1.21, EGFR 65 mL, potassium 4.7.  Urinary albumin markedly elevated at 492  mg.  A1c 11.5%.  Total cholesterol 100, triglycerides 109, HDL 35, LDL 50.  Hb 12.3/HCT 36.9, platelets 179, normal indicis.  TSH normal at 2.11.  Medications and allergies   Allergies  Allergen Reactions   Penicillins Other (See Comments)    Did it involve swelling of the face/tongue/throat, SOB, or low BP? Unknown Did it involve sudden or severe rash/hives, skin peeling, or any reaction on the inside of your mouth or nose? Unknown Did you need to seek medical attention at a hospital or doctor's office? Unknown When did it last happen?      Childhood If all above answers are "NO", may proceed with cephalosporin use.     Current Outpatient Medications:    aspirin EC 81 MG tablet, Take 81 mg by mouth daily., Disp: , Rfl:    Blood Glucose Monitoring Suppl (ONE TOUCH ULTRA 2) w/Device KIT, See admin instructions., Disp: , Rfl:    chlorthalidone (HYGROTON) 25 MG tablet, Take 1 tablet (25 mg total) by mouth every morning., Disp: 90 tablet, Rfl: 3   diltiazem (CARDIZEM CD) 240 MG 24 hr  capsule, Take 1 capsule (240 mg total) by mouth daily., Disp: 90 capsule, Rfl: 3   escitalopram (LEXAPRO) 10 MG tablet, Take half pill at night  for one week and increase to a whole pill, Disp: , Rfl:    Finerenone (KERENDIA) 10 MG TABS, Take 1 tablet by mouth daily., Disp: 30 tablet, Rfl: 2   GLIPIZIDE XL 10 MG 24 hr tablet, Take 10 mg by mouth daily., Disp: , Rfl:    JANUVIA 100 MG tablet, Take 100 mg by mouth at bedtime. , Disp: , Rfl:    Melatonin 10 MG TABS, Take 1 tablet by mouth at bedtime., Disp: , Rfl:    metFORMIN (GLUCOPHAGE-XR) 500 MG 24 hr tablet, Take 2,000 mg by mouth at bedtime., Disp: , Rfl:    pioglitazone (ACTOS) 30 MG tablet, Take 1 tablet by mouth at bedtime. , Disp: , Rfl:    rosuvastatin (CRESTOR) 20 MG tablet, TAKE 1 TABLET(20 MG) BY MOUTH DAILY, Disp: 90 tablet, Rfl: 3   lisinopril (ZESTRIL) 10 MG tablet, Take 1 tablet (10 mg total) by mouth daily., Disp: 90 tablet, Rfl: 3   Radiology:  No results found.  Cardiac Studies:   Exercise sestamibi stress test done on 12/22/2011 revealed that patient was able to walk for 8 minutes and achieved 10 metastases. There is no evidence of ischemia. There was mild diaphragmatic attenuation artifact. Overall a low risk stress test.  Carotid duplex that was performed to evaluate carotid bruit on 11/04/2011 revealed mild heterogeneous plaque without significant carotid stenosis.  Echocardiogram 05/01/2019:  Hyperdynamic LV systolic function with visual EF >70%. Left ventricle  cavity is normal in size. Mild left ventricular hypertrophy. Normal global  wall motion. Doppler evidence of grade II diastolic dysfunction, elevated  LAP. Calculated EF 67%.  Left atrial cavity is mildly dilated.  Peak velocity 2.45m/s, mean gradient 14mmHg, Dimensional index 0.5, findings suggestive of aortic sclerosis without stenosis.  Mild mitral annular calcification. Mild mitral regurgitation.  No other significant valvular abnormalities.  No significant  change compared to prior study dated 12/17/2011.  Carotid artery duplex 08/18/2021: Duplex suggests stenosis in the right internal carotid artery (1-15%). Duplex suggests stenosis in the right external carotid artery (<50%). Duplex suggests stenosis in the left internal carotid artery (1-15%). Duplex suggests stenosis in the left external carotid artery (<50%). Antegrade right vertebral artery flow. Antegrade left vertebral artery flow.  Compared to 09/14/2020, right ICA stenosis of 15 to 49% is no longer evident.  Follow-up studies if clinically indicated.  EKG:  EKG 08/31/2021: Sinus rhythm with first-degree AV block at rate of 83 bpm, otherwise normal EKG.  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  Otherwise normal QRS and T waves.  EKG 08/27/2020, repeated due to AV block: Sinus rhythm with first-degree block at rate of 69 bpm, normal axis.  Single PVC.  EKG 03/28/2019: Sinus rhythm with first-degree block with a rate of 79 bpm, normal axis, poor R wave progression, probably normal variant but cannot exclude anteroseptal infarct old.  No evidence of ischemia, otherwise normal EKG.   Assessment     ICD-10-CM   1. Primary hypertension  I10 EKG 12-Lead    lisinopril (ZESTRIL) 10 MG tablet    chlorthalidone (HYGROTON) 25 MG tablet    Basic metabolic panel    2. Pure hypercholesterolemia  E78.00     3. Bilateral carotid bruits  R09.89     4. Type 2 diabetes mellitus with microalbuminuria, without long-term current use of insulin (HCC)  E11.29 Finerenone (KERENDIA) 10 MG TABS   R80.9     5. Aortic systolic murmur on examination  I35.8 PCV ECHOCARDIOGRAM COMPLETE      Meds ordered this encounter  Medications   lisinopril (ZESTRIL) 10 MG tablet    Sig: Take 1 tablet (10 mg total) by mouth daily.    Dispense:  90 tablet    Refill:  3    chlorthalidone (HYGROTON) 25 MG tablet    Sig: Take 1 tablet (25 mg total) by mouth every morning.    Dispense:  90 tablet    Refill:  3   Finerenone (KERENDIA) 10 MG TABS    Sig: Take 1 tablet by mouth daily.    Dispense:  30 tablet    Refill:  2   Medications Discontinued During This Encounter  Medication Reason   fluticasone (FLONASE) 50 MCG/ACT nasal spray    lisinopril (ZESTRIL) 5 MG tablet Change in therapy    Recommendations:   Eddie Carpenter  is a 71 y.o. with HTN, HLD, DM, mild bilateral carotid atherosclerosis without high-grade stenosis,  OSA on CPAP presents for annual visit.  He is presently asymptomatic although he has sedentary lifestyle.    Blood pressure is elevated, I reviewed his external labs, he has significant amount of proteinuria related to uncontrolled diabetes as well.  I increased the dose of the lisinopril from 5 mg to 10 mg, she has history of hypokalemia hence also added chlorthalidone 25 mg in the morning and will obtain BMP in 3 to 4 weeks.  He would be an excellent candidate for [facility] Clamp, prescription sent, he will look into the prescription coverage prior to obtaining any samples.  Aortic sclerotic murmur is much more prominent.  I would like to repeat echocardiogram to follow-up on aortic stenotic murmur.  Carotid artery duplex reveals external carotid stenosis but no significant internal carotid artery stenosis.  No further evaluation is indicated with regard to this.  External labs reviewed, renal function is normal, lipids are well controlled, diabetes continues to be very much uncontrolled.  Obesity and weight loss discussed with the patient.   Adrian Prows, MD, [facility] 08/31/2021, 12:33 PM Office: [phone] Fax: [phone] Pager: [phone]

## 2021-09-07 ENCOUNTER — Other Ambulatory Visit: Payer: Self-pay | Admitting: Cardiology

## 2021-09-07 DIAGNOSIS — I10 Essential (primary) hypertension: Secondary | ICD-10-CM

## 2021-09-07 DIAGNOSIS — E7800 Pure hypercholesterolemia, unspecified: Secondary | ICD-10-CM

## 2021-09-23 LAB — BASIC METABOLIC PANEL
BUN/Creatinine Ratio: 24 (ref 10–24)
BUN: 28 mg/dL — ABNORMAL HIGH (ref 8–27)
CO2: 18 mmol/L — ABNORMAL LOW (ref 20–29)
Calcium: 9.2 mg/dL (ref 8.6–10.2)
Chloride: 105 mmol/L (ref 96–106)
Creatinine, Ser: 1.17 mg/dL (ref 0.76–1.27)
Glucose: 131 mg/dL — ABNORMAL HIGH (ref 70–99)
Potassium: 4.9 mmol/L (ref 3.5–5.2)
Sodium: 137 mmol/L (ref 134–144)
eGFR: 67 mL/min/{1.73_m2} (ref >59)

## 2021-10-05 ENCOUNTER — Ambulatory Visit: Payer: Medicare Other

## 2021-10-05 DIAGNOSIS — I358 Other nonrheumatic aortic valve disorders: Secondary | ICD-10-CM

## 2021-10-07 ENCOUNTER — Other Ambulatory Visit: Payer: Self-pay

## 2021-10-07 DIAGNOSIS — I10 Essential (primary) hypertension: Secondary | ICD-10-CM

## 2021-10-07 MED ORDER — LISINOPRIL 10 MG PO TABS: 10.0000 mg | ORAL_TABLET | Freq: Every day | ORAL | 3 refills | Status: DC

## 2021-10-10 ENCOUNTER — Other Ambulatory Visit: Payer: Self-pay

## 2021-10-10 DIAGNOSIS — I10 Essential (primary) hypertension: Secondary | ICD-10-CM

## 2021-10-10 MED ORDER — LISINOPRIL 10 MG PO TABS: 10.0000 mg | ORAL_TABLET | Freq: Every day | ORAL | 3 refills | Status: DC

## 2021-11-03 ENCOUNTER — Ambulatory Visit: Payer: Medicare Other | Admitting: Cardiology

## 2021-11-03 ENCOUNTER — Encounter: Payer: Self-pay | Admitting: Cardiology

## 2021-11-03 VITALS — BP 124/68 | HR 80 | Temp 97.8°F | Resp 16 | Ht 68.0 in | Wt 265.0 lb

## 2021-11-03 DIAGNOSIS — E1129 Type 2 diabetes mellitus with other diabetic kidney complication: Secondary | ICD-10-CM

## 2021-11-03 DIAGNOSIS — E7800 Pure hypercholesterolemia, unspecified: Secondary | ICD-10-CM

## 2021-11-03 DIAGNOSIS — I10 Essential (primary) hypertension: Secondary | ICD-10-CM

## 2021-11-03 DIAGNOSIS — R0609 Other forms of dyspnea: Secondary | ICD-10-CM

## 2021-11-03 DIAGNOSIS — I350 Nonrheumatic aortic (valve) stenosis: Secondary | ICD-10-CM

## 2021-11-03 NOTE — Progress Notes (Signed)
Primary Physician/Referring:  Aletha Halim., PA-C  Patient ID: Eddie Carpenter, male    DOB: 19-Nov-1950, 71 y.o.   MRN: [id number]  Chief Complaint  Patient presents with   Hypertension   Aortic Stenosis   Follow-up    2 months   HPI:    Eddie Carpenter  is a 71 y.o. with HTN, HLD, DM, mild bilateral carotid atherosclerosis without high-grade stenosis,  OSA on CPAP presents for annual visit.  Except for mild dyspnea when he climbs a flight of stairs.  States that he is essentially asymptomatic and does admit to being sedentary.  Although plays golf but uses a cart and does walk on the green.    I seen him 4 weeks ago, had recommended [place] however he could not afford it.  I would also increase the dose of lisinopril which she is tolerating.  States that he has noticed improvement in his blood pressure.  He denies any chest pain, leg edema, PND or orthopnea.  Past Medical History:  Diagnosis Date   Coronary artery disease    Diabetes mellitus without complication ([place])    Dog bite of right upper extremity 08/18/2018   Hyperlipidemia    Hypertension    Prostate cancer [facility])    Past Surgical History:  Procedure Laterality Date   I & D EXTREMITY Right 08/18/2018   Procedure: IRRIGATION AND DEBRIDEMENT RIGHT ARM AND REPAIR OF LACERATION;  Surgeon: Renette Butters, MD;  Location: [place];  Service: Orthopedics;  Laterality: Right;   Social History   Tobacco Use   Smoking status: Former    Packs/day: 1.00    Years: 40.00    Total pack years: 40.00    Types: Cigarettes    Start date: 2018    Quit date: 02/20/2017    Years since quitting: 4.7   Smokeless tobacco: Never  Substance Use Topics   Alcohol use: Never  Marital Status: Married   ROS  Review of Systems  Cardiovascular:  Positive for dyspnea on exertion. Negative for chest pain and leg swelling.  Gastrointestinal:  Negative for melena.   Objective  Blood pressure 124/68, pulse 80, temperature 97.8 F  (36.6 C), temperature source Temporal, resp. rate 16, height _0  (1.727 m), weight 265 lb (120.2 kg), SpO2 98 %.     11/03/2021   10:01 AM 08/31/2021   10:05 AM 08/31/2021    9:59 AM  Vitals with BMI  Height _1   _2   Weight 265 lbs  264 lbs  BMI 87.8  67.67  Systolic 209 470 962  Diastolic 68 68 76  Pulse 80 80 81     Physical Exam Neck:     Vascular: Carotid bruit present. No JVD.  Cardiovascular:     Rate and Rhythm: Normal rate and regular rhythm.     Pulses:          Dorsalis pedis pulses are 1+ on the right side and 1+ on the left side.       Posterior tibial pulses are 0 on the right side and 0 on the left side.     Heart sounds: Murmur heard.     Harsh midsystolic murmur is present with a grade of 2/6 at the upper right sternal border radiating to the neck.     No gallop.  Pulmonary:     Effort: Pulmonary effort is normal.     Breath sounds: Normal breath sounds.  Abdominal:     General:  Bowel sounds are normal.     Palpations: Abdomen is soft.  Musculoskeletal:        General: No swelling.     Comments: Bilateral varicose veins noted in the calf.    Laboratory examination:   Lab Results  Component Value Date   NA 137 09/22/2021   K 4.9 09/22/2021   CO2 18 (L) 09/22/2021   GLUCOSE 131 (H) 09/22/2021   BUN 28 (H) 09/22/2021   CREATININE 1.17 09/22/2021   CALCIUM 9.2 09/22/2021   EGFR 67 09/22/2021   GFRNONAA 65 04/17/2019    External labs   Labs 07/28/2021:  Serum glucose 252.  BUN 22, serum creatinine 1.21, EGFR 65 mL, potassium 4.7.  Urinary albumin markedly elevated at 492 mg.  A1c 11.5%.  Total cholesterol 100, triglycerides 109, HDL 35, LDL 50.  Hb 12.3/HCT 36.9, platelets 179, normal indicis.  TSH normal at 2.11.  Medications and allergies   Allergies  Allergen Reactions   Penicillins Other (See Comments)    Did it involve swelling of the face/tongue/throat, SOB, or low BP? Unknown Did it involve sudden or severe rash/hives, skin  peeling, or any reaction on the inside of your mouth or nose? Unknown Did you need to seek medical attention at a hospital or doctor's office? Unknown When did it last happen?      Childhood If all above answers are "NO", may proceed with cephalosporin use.     Current Outpatient Medications:    aspirin EC 81 MG tablet, Take 81 mg by mouth daily., Disp: , Rfl:    Blood Glucose Monitoring Suppl (ONE TOUCH ULTRA 2) w/Device KIT, See admin instructions., Disp: , Rfl:    chlorthalidone (HYGROTON) 25 MG tablet, Take 1 tablet (25 mg total) by mouth every morning., Disp: 90 tablet, Rfl: 3   diltiazem (CARDIZEM CD) 240 MG 24 hr capsule, TAKE 1 CAPSULE(240 MG) BY MOUTH DAILY, Disp: 90 capsule, Rfl: 3   escitalopram (LEXAPRO) 10 MG tablet, Take half pill at night  for one week and increase to a whole pill, Disp: , Rfl:    GLIPIZIDE XL 10 MG 24 hr tablet, Take 10 mg by mouth daily., Disp: , Rfl:    JANUVIA 100 MG tablet, Take 100 mg by mouth at bedtime. , Disp: , Rfl:    lisinopril (ZESTRIL) 10 MG tablet, Take 1 tablet (10 mg total) by mouth daily., Disp: 90 tablet, Rfl: 3   Melatonin 10 MG TABS, Take 1 tablet by mouth at bedtime., Disp: , Rfl:    metFORMIN (GLUCOPHAGE-XR) 500 MG 24 hr tablet, Take 2,000 mg by mouth at bedtime., Disp: , Rfl:    pioglitazone (ACTOS) 30 MG tablet, Take 1 tablet by mouth at bedtime. , Disp: , Rfl:    rosuvastatin (CRESTOR) 20 MG tablet, TAKE 1 TABLET(20 MG) BY MOUTH DAILY, Disp: 90 tablet, Rfl: 3   Radiology:    Cardiac Studies:   Exercise sestamibi stress test done on 12/22/2011 revealed that patient was able to walk for 8 minutes and achieved 10 metastases. There is no evidence of ischemia. There was mild diaphragmatic attenuation artifact. Overall a low risk stress test.  Carotid artery duplex 08/18/2021: Duplex suggests stenosis in the right internal carotid artery (1-15%). Duplex suggests stenosis in the right external carotid artery (<50%). Duplex suggests  stenosis in the left internal carotid artery (1-15%). Duplex suggests stenosis in the left external carotid artery (<50%). Antegrade right vertebral artery flow. Antegrade left vertebral artery flow. Compared to 09/14/2020, right  ICA stenosis of 15 to 49% is no longer evident.  Follow-up studies if clinically indicated.  PCV ECHOCARDIOGRAM COMPLETE 10/05/2021  Narrative Echocardiogram 10/05/2021: 1. Normal LV systolic function with visual EF 55-60%. Left ventricle cavity is normal in size. Moderate concentric hypertrophy of the left ventricle. Normal global wall motion. Doppler evidence of grade I (impaired) diastolic dysfunction, elevated LAP. 2. Trileaflet aortic valve with no regurgitation. Mild aortic valve leaflet thickening with moderate calcification. Moderate aortic valve stenosis. AVA (VTI) measures 1.3 cm^2. AV Mean Grad measures 23.5 mmHg. AV Pk Vel measures 3.14 m/s. 3. No mitral valve regurgitation. Mild mitral valve leaflet calcification. E-wave dominant mitral inflow. 4. Structurally normal tricuspid valve with no regurgitation. No evidence of pulmonary hypertension.   EKG:  EKG 08/31/2021: Sinus rhythm with first-degree AV block at rate of 83 bpm, otherwise normal EKG.  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  Otherwise normal QRS and T waves.  EKG 08/27/2020, repeated due to AV block: Sinus rhythm with first-degree block at rate of 69 bpm, normal axis.  Single PVC.  Assessment     ICD-10-CM   1. Primary hypertension  I10     2. Hypercholesteremia  E78.00     3. Type 2 diabetes mellitus with microalbuminuria, without long-term current use of insulin (HCC)  E11.29 PCV MYOCARDIAL PERFUSION WO LEXISCAN   R80.9     4. Mild aortic stenosis  I35.0     5. Dyspnea on exertion  R06.09 PCV MYOCARDIAL PERFUSION WO LEXISCAN      No  orders of the defined types were placed in this encounter.  Medications Discontinued During This Encounter  Medication Reason   Finerenone (KERENDIA) 10 MG TABS Cost of medication     Recommendations:   ARGIE APPLEGATE  is a 71 y.o. with HTN, HLD, DM, mild bilateral carotid atherosclerosis without high-grade stenosis,  OSA on CPAP presents for annual visit.  Except for mild dyspnea when he climbs a flight of stairs.  States that he is essentially asymptomatic and does admit to being sedentary.  Although plays golf but uses a cart and does walk on the green.    I seen him 4 weeks ago, had recommended [place] however he could not afford it.  He does have proteinuria related to diabetes which is uncontrolled, diabetes mellitus  is gradually improving.  He is now tolerating lisinopril well and blood pressure is now well controlled.  He has had history of hyperkalemia in the past however repeat BMP reveals very stable potassium levels.  Carotid artery minimal plaque, echocardiogram revealing very mild aortic stenosis which is unchanged from previous.  Blood pressure is now well controlled.  Because of his very high risk factors, I would recommend an exercise nuclear stress test as it has been >10 years since his last stress test.  Unless this is high risk or markedly abnormal, I will see him back in a year.    Adrian Prows, MD, [facility] 11/03/2021, 10:54 AM Office: [phone] Fax: [phone] Pager: [phone]

## 2021-11-23 ENCOUNTER — Other Ambulatory Visit: Payer: Medicare Other

## 2021-11-24 ENCOUNTER — Ambulatory Visit: Payer: Medicare Other

## 2021-11-24 DIAGNOSIS — R809 Proteinuria, unspecified: Secondary | ICD-10-CM

## 2021-11-24 DIAGNOSIS — R0609 Other forms of dyspnea: Secondary | ICD-10-CM

## 2022-03-26 NOTE — Progress Notes (Unsigned)
HPI M former smoker followed for OSA, complicated by   HTN, HLD, DM2, CKD 3, Aortic Stenosis, Hx Prostate Cancer, Hyperlipidemia, HST 10/04/19- AHI 69.1/ hr, desaturation to 84%, body weight 223 lbs  =======================================================  03/29/21- 70 yoM former smoker followed for OSA, complicated by   HTN, HLD, DM2, CKD 3, Aortic Stenosis, Hx Prostate Cancer, Hyperlipidemia, HST 10/04/19- AHI 69.1/ hr, desaturation to 84%, body weight 223 lbs CPAP auto 5-15/ [employer]     Replacement machine ordered 10/28/19 Download- compliance   97%, AHI 19.9    Mostly [facility]       Body weight today-253 lbs Covid vax- 3 Phizer Flu vax-had Pressure ranging 9.5-13.8            Modest leak -----Patient doing good no concerns He has been using CPAP since 1990.  Current machine replaced 2021.  Download reviewed noting significant residual AHI, predominantly central apneas.  Machine is not pressure limiting so I do not think a pressure change would make a difference.  Modest leak.  He does not use a humidifier.  He does wish airflow were cooler and I have asked him to contact the DME company for advice.  He feels he sleeps well and is comfortable with CPAP.  Nasal mask. Some nasal stuffiness for which he will resume Flonase.  03/28/22- 49 yoM former smoker( 40 pkyrs) followed for OSA, complicated by   HTN, HLD, DM2, CKD 3, Aortic Stenosis, Hx Prostate Cancer, Hyperlipidemia, CPAP auto 5-15/ [employer]     Replacement machine ordered 10/28/19 Download- compliance        100%, AHI 18.9/ hr Body weight today-279 lbs Covid vax- 3 Phizer Flu vax-today senior -----Pt is doing well Download became available after he had left.  He didn't report any concerns with breakthrough snoring or inadequate sleep.  Since he is comfortable, I will leave settings as they are for now, but consider increasing pressure range next visit. He says cardiology studies including stress test went well this year.  Flu vax-had ROS-see  HPI   + = positive Constitutional:    weight loss, night sweats, fevers, chills, fatigue, lassitude. HEENT:    headaches, difficulty swallowing, tooth/dental problems, sore throat,       sneezing, itching, ear ache, nasal congestion, post nasal drip, snoring CV:    chest pain, orthopnea, PND, swelling in lower extremities, anasarca,                                   dizziness, palpitations Resp:   shortness of breath with exertion or at rest.                productive cough,   non-productive cough, coughing up of blood.              change in color of mucus.  wheezing.   Skin:    rash or lesions. GI:  No-   heartburn, indigestion, abdominal pain, nausea, vomiting, diarrhea,                 change in bowel habits, loss of appetite GU: dysuria, change in color of urine, no urgency or frequency.   flank pain. MS:   joint pain, stiffness, decreased range of motion, back pain. Neuro-     nothing unusual Psych:  change in mood or affect.  depression or anxiety.   memory loss.  OBJ- Physical Exam General- Alert, Oriented, Affect-appropriate, Distress- none  acute, + obese Skin- rash-none, lesions- none, excoriation- none Lymphadenopathy- none Head- atraumatic            Eyes- Gross vision intact, PERRLA, conjunctivae and secretions clear            Ears- Hearing, canals-normal            Nose- Clear, no-Septal dev, mucus, polyps, erosion, perforation             Throat- Mallampati II , mucosa clear , drainage- none, tonsils- atrophic, + teeth Neck- flexible , trachea midline, no stridor , thyroid nl, carotid no bruit Chest - symmetrical excursion , unlabored           Heart/CV- RRR ,  murmur + 2/6 AS, no gallop  , no rub, nl s1 s2                           - JVD- none , edema- none, stasis changes- none, varices- none           Lung- clear to P&A, wheeze- none, cough- none , dullness-none, rub- none           Chest wall-  Abd-  Br/ Gen/ Rectal- Not done, not indicated Extrem- cyanosis-  none, clubbing, none, atrophy- none, strength- nl Neuro- grossly intact to observation

## 2022-03-28 ENCOUNTER — Encounter: Payer: Self-pay | Admitting: Internal Medicine

## 2022-03-28 ENCOUNTER — Ambulatory Visit (INDEPENDENT_AMBULATORY_CARE_PROVIDER_SITE_OTHER): Payer: Medicare Other | Admitting: Internal Medicine

## 2022-03-28 VITALS — BP 134/64 | HR 79 | Ht 68.0 in | Wt 279.0 lb

## 2022-03-28 DIAGNOSIS — Z23 Encounter for immunization: Secondary | ICD-10-CM | POA: Diagnosis not present

## 2022-03-28 DIAGNOSIS — G4733 Obstructive sleep apnea (adult) (pediatric): Secondary | ICD-10-CM

## 2022-03-28 DIAGNOSIS — E669 Obesity, unspecified: Secondary | ICD-10-CM | POA: Diagnosis not present

## 2022-03-28 NOTE — Assessment & Plan Note (Signed)
Benefits from CPAP and he is comfortable with current settings.  Control is not optimal but since he has left by the time download available, we will look again next year before adjusting pressure range.

## 2022-03-28 NOTE — Patient Instructions (Signed)
We can continue CPAP auto 5-15  You might look at [email or web address] or such on line to see if they carry what you need.

## 2022-03-28 NOTE — Assessment & Plan Note (Signed)
Diet and exercise stressed for normalization of body weight.

## 2022-09-05 ENCOUNTER — Other Ambulatory Visit: Payer: Self-pay | Admitting: Cardiology

## 2022-09-05 DIAGNOSIS — E7800 Pure hypercholesterolemia, unspecified: Secondary | ICD-10-CM

## 2022-09-12 ENCOUNTER — Other Ambulatory Visit: Payer: Self-pay | Admitting: Cardiology

## 2022-09-12 DIAGNOSIS — I10 Essential (primary) hypertension: Secondary | ICD-10-CM

## 2022-10-01 ENCOUNTER — Other Ambulatory Visit: Payer: Self-pay | Admitting: Cardiology

## 2022-10-01 DIAGNOSIS — I10 Essential (primary) hypertension: Secondary | ICD-10-CM

## 2022-11-02 ENCOUNTER — Encounter: Payer: Self-pay | Admitting: Cardiology

## 2022-11-02 ENCOUNTER — Ambulatory Visit: Payer: Medicare Other | Admitting: Cardiology

## 2022-11-02 VITALS — BP 140/77 | HR 83 | Resp 16 | Ht 68.0 in | Wt 275.0 lb

## 2022-11-02 DIAGNOSIS — I350 Nonrheumatic aortic (valve) stenosis: Secondary | ICD-10-CM

## 2022-11-02 DIAGNOSIS — E1129 Type 2 diabetes mellitus with other diabetic kidney complication: Secondary | ICD-10-CM

## 2022-11-02 DIAGNOSIS — I10 Essential (primary) hypertension: Secondary | ICD-10-CM

## 2022-11-02 DIAGNOSIS — E7800 Pure hypercholesterolemia, unspecified: Secondary | ICD-10-CM

## 2022-11-02 MED ORDER — EMPAGLIFLOZIN 25 MG PO TABS
25.0000 mg | ORAL_TABLET | Freq: Every day | ORAL | 3 refills | Status: DC
Start: 2022-11-02 — End: 2023-02-12

## 2022-11-02 NOTE — Progress Notes (Signed)
Primary Physician/Referring:  Richmond Campbell., PA-C  Patient ID: Eddie Carpenter, male    DOB: Feb 26, 1950, 72 y.o.   MRN: [id number]  Chief Complaint  Patient presents with  . Hypertension  . Hyperlipidemia  . Follow-up    1 year   HPI:    Eddie Carpenter  is a 72 y.o. with HTN, HLD, DM, mild bilateral carotid atherosclerosis without high-grade stenosis,  OSA on CPAP presents for annual visit.  Except for mild dyspnea when he climbs a flight of stairs.  States that he is essentially asymptomatic and does admit to being sedentary.  Although plays golf but uses a cart and does walk on the green.    Presently except for chronic dyspnea, no other specific complaints.  No PND or orthopnea.  He denies any chest pain, leg edema, PND or orthopnea.  Past Medical History:  Diagnosis Date  . Coronary artery disease   . Diabetes mellitus without complication (HCC)   . Dog bite of right upper extremity 08/18/2018  . Hyperlipidemia   . Hypertension   . Prostate cancer [facility])    Past Surgical History:  Procedure Laterality Date  . I & D EXTREMITY Right 08/18/2018   Procedure: IRRIGATION AND DEBRIDEMENT RIGHT ARM AND REPAIR OF LACERATION;  Surgeon: Sheral Apley, MD;  Location: [facility] OR;  Service: Orthopedics;  Laterality: Right;   Social History   Tobacco Use  . Smoking status: Former    Current packs/day: 0.00    Average packs/day: 1 pack/day for 40.0 years (40.0 ttl pk-yrs)    Types: Cigarettes    Start date: 2018    Quit date: 02/20/2017    Years since quitting: 5.7  . Smokeless tobacco: Never  Substance Use Topics  . Alcohol use: Never  Marital Status: Married   ROS  Review of Systems  Cardiovascular:  Positive for dyspnea on exertion. Negative for chest pain and leg swelling.  Gastrointestinal:  Negative for melena.   Objective  Blood pressure (!) 140/77, pulse 83, resp. rate 16, height 5\' 8"  (1.727 m), weight 275 lb (124.7 kg), SpO2 96%.     11/02/2022   10:02 AM  03/28/2022   10:29 AM 11/03/2021   10:01 AM  Vitals with BMI  Height 5\' 8"  5\' 8"  5\' 8"   Weight 275 lbs 279 lbs 265 lbs  BMI 41.82 42.43 40.3  Systolic 140 134 409  Diastolic 77 64 68  Pulse 83 79 80     Physical Exam Constitutional:      Appearance: He is morbidly obese.  Neck:     Vascular: Carotid bruit (bilateral) present. No JVD.  Cardiovascular:     Rate and Rhythm: Normal rate and regular rhythm.     Pulses:          Dorsalis pedis pulses are 1+ on the right side and 1+ on the left side.       Posterior tibial pulses are 0 on the right side and 0 on the left side.     Heart sounds: Murmur heard.     Harsh midsystolic murmur is present with a grade of 2/6 at the upper right sternal border radiating to the neck.     No gallop.  Pulmonary:     Effort: Pulmonary effort is normal.     Breath sounds: Normal breath sounds.  Abdominal:     General: Bowel sounds are normal.     Palpations: Abdomen is soft.  Musculoskeletal:  General: No swelling.     Comments: Bilateral varicose veins noted in the calf.   Laboratory examination:   External labs   Labs 07/31/2022:  Total cholesterol 114, triglycerides 147, HDL 25, LDL 56.  Sodium 136, potassium 4.5, BUN 23, creatinine 1.33, EGFR 58 mL.  A1c 9.3%.  Hb 13.9/HCT 39.5, platelets 172.  Medications and allergies   Allergies  Allergen Reactions  . Penicillins Other (See Comments)    Did it involve swelling of the face/tongue/throat, SOB, or low BP? Unknown Did it involve sudden or severe rash/hives, skin peeling, or any reaction on the inside of your mouth or nose? Unknown Did you need to seek medical attention at a hospital or doctor's office? Unknown When did it last happen?      Childhood If all above answers are "NO", may proceed with cephalosporin use.     Current Outpatient Medications:  .  aspirin EC 81 MG tablet, Take 81 mg by mouth daily., Disp: , Rfl:  .  Blood Glucose Monitoring Suppl (ONE TOUCH ULTRA  2) w/Device KIT, See admin instructions., Disp: , Rfl:  .  chlorthalidone (HYGROTON) 25 MG tablet, Take 1 tablet (25 mg total) by mouth every morning., Disp: 90 tablet, Rfl: 3 .  diltiazem (CARDIZEM CD) 240 MG 24 hr capsule, TAKE 1 CAPSULE(240 MG) BY MOUTH DAILY, Disp: 90 capsule, Rfl: 3 .  escitalopram (LEXAPRO) 10 MG tablet, Take half pill at night  for one week and increase to a whole pill, Disp: , Rfl:  .  Ferrous Sulfate Dried (SLOW RELEASE IRON) 45 MG TBCR, Take by mouth., Disp: , Rfl:  .  GLIPIZIDE XL 10 MG 24 hr tablet, Take 10 mg by mouth daily., Disp: , Rfl:  .  JANUVIA 100 MG tablet, Take 100 mg by mouth at bedtime. , Disp: , Rfl:  .  lisinopril (ZESTRIL) 10 MG tablet, TAKE 1 TABLET(10 MG) BY MOUTH DAILY, Disp: 90 tablet, Rfl: 3 .  Melatonin 10 MG TABS, Take 1 tablet by mouth at bedtime., Disp: , Rfl:  .  metFORMIN (GLUCOPHAGE-XR) 500 MG 24 hr tablet, Take 2,000 mg by mouth at bedtime., Disp: , Rfl:  .  mupirocin ointment (BACTROBAN) 2 %, 1 Application 2 (two) times daily., Disp: , Rfl:  .  pioglitazone (ACTOS) 30 MG tablet, Take 1 tablet by mouth at bedtime. , Disp: , Rfl:  .  rosuvastatin (CRESTOR) 20 MG tablet, TAKE 1 TABLET(20 MG) BY MOUTH DAILY, Disp: 90 tablet, Rfl: 3   Radiology:    Cardiac Studies:   Carotid artery duplex 2021/09/15: Duplex suggests stenosis in the right internal carotid artery (1-15%). Duplex suggests stenosis in the right external carotid artery (<50%). Duplex suggests stenosis in the left internal carotid artery (1-15%). Duplex suggests stenosis in the left external carotid artery (<50%). Antegrade right vertebral artery flow. Antegrade left vertebral artery flow. Compared to 09/14/2020, right ICA stenosis of 15 to 49% is no longer evident.  Follow-up studies if clinically indicated.  PCV ECHOCARDIOGRAM COMPLETE 10/05/2021  Narrative Echocardiogram 10/05/2021: 1. Normal LV systolic function with visual EF 55-60%. Left ventricle cavity is normal in  size. Moderate concentric hypertrophy of the left ventricle. Normal global wall motion. Doppler evidence of grade I (impaired) diastolic dysfunction, elevated LAP. 2. Trileaflet aortic valve with no regurgitation. Mild aortic valve leaflet thickening with moderate calcification. Moderate aortic valve stenosis. AVA (VTI) measures 1.3 cm^2. AV Mean Grad measures 23.5 mmHg. AV Pk Vel measures 3.14 m/s. 3. No mitral valve regurgitation. Mild  mitral valve leaflet calcification. E-wave dominant mitral inflow. 4. Structurally normal tricuspid valve with no regurgitation. No evidence of pulmonary hypertension.   PCV MYOCARDIAL PERFUSION WO LEXISCAN 11/24/2021  Narrative Exercise Sestamibi stress test 11/24/2021: Exercise nuclear stress test was performed using Bruce protocol. Patient reached 5.9 METS, and 97% of age predicted maximum heart rate. Exercise capacity was low. No chest pain reported, dyspnea reported. Heart rate and hemodynamic response were normal. Stress EKG revealed no ischemic changes. SPECT images show small sized, mild intensity, reversible perfusion defect in apical anteroseptal myocardium. Normal wall motion and myocardial thickening. Stress LVEF 73%. Low risk study.    EKG:  EKG 11/02/2022: Sinus rhythm first-degree block at the rate of 82 bpm, otherwise normal EKG.  Compared to 08/31/2021, no significant change.  Assessment     ICD-10-CM   1. Mild aortic stenosis  I35.0 EKG 12-Lead    2. Pure hypercholesterolemia  E78.00     3. Primary hypertension  I10       No orders of the defined types were placed in this encounter.  There are no discontinued medications.    Recommendations:   Eddie Carpenter  is a 72 y.o. with HTN, HLD, DM, mild bilateral carotid atherosclerosis without high-grade stenosis,  OSA on CPAP presents for annual visit.  Except for mild dyspnea when he climbs a flight of stairs.  States that he is essentially asymptomatic and does admit to being  sedentary.  Although plays golf but uses a cart and does walk on the green.    1. Mild aortic stenosis Patient's physical examination is unchanged with regard to aortic stenosis murmur.  He has had an echocardiogram just a year ago which revealed only mild aortic stenosis.  Will continue watchful observation for now.  No change in symptoms of dyspnea that has remained stable.  No clinical evidence of heart failure. - EKG 12-Lead  2. Pure hypercholesterolemia I reviewed his external labs, cholesterol is very well-controlled and presently on high intensity statins.  3. Primary hypertension Blood pressure was elevated at 140 mmHg systolic, in view of this I would like to add Jardiance 25 mg daily both for blood pressure reduction and also for diabetes control.  4. Type 2 diabetes mellitus with microalbuminuria, without long-term current use of insulin (HCC) Patient with chronic kidney disease and also microalbuminuria, Jardiance will play a protective role in renal dysfunction as well.  Hopefully this will help with diabetes control and weight loss as well.  I would highly consider Mounjaro to be started on him, obesity is major issue as well.  I suspect with obesity and weight loss, his outcomes will be significantly improved.  Overall stable from cardiovascular standpoint, I will see him back in a year or sooner if problems.  With regard to carotid artery disease, he has mild carotid plaque although he has fairly prominent carotid bruit, he is already on a statin and also on aspirin, continue the same.  - empagliflozin (JARDIANCE) 25 MG TABS tablet; Take 1 tablet (25 mg total) by mouth daily before breakfast.  Dispense: 30 tablet; Refill: 3     Eddie Decamp, MD, [facility] 11/02/2022, 10:34 AM Office: [phone] Fax: [phone] Pager: [phone]

## 2022-11-24 ENCOUNTER — Other Ambulatory Visit: Payer: Self-pay | Admitting: Cardiology

## 2022-11-24 DIAGNOSIS — I10 Essential (primary) hypertension: Secondary | ICD-10-CM

## 2023-02-10 ENCOUNTER — Other Ambulatory Visit: Payer: Self-pay | Admitting: Cardiology

## 2023-02-10 DIAGNOSIS — E1129 Type 2 diabetes mellitus with other diabetic kidney complication: Secondary | ICD-10-CM

## 2023-02-12 MED ORDER — EMPAGLIFLOZIN 25 MG PO TABS: 25.0000 mg | ORAL_TABLET | Freq: Every day | ORAL | 2 refills | Status: DC

## 2023-03-28 NOTE — Progress Notes (Signed)
 HPI M former smoker followed for OSA, complicated by   HTN, HLD, DM2, CKD 3, Aortic Stenosis, Hx Prostate Cancer, Hyperlipidemia, HST 10/04/19- AHI 69.1/ hr, desaturation to 84%, body weight 223 lbs  ======================================================= 03/28/22- 71 yoM former smoker( 40 pkyrs) followed for OSA, complicated by   HTN, HLD, DM2, CKD 3, Aortic Stenosis, Hx Prostate Cancer, Hyperlipidemia, CPAP auto 5-15/ [facility]     Replacement machine ordered 10/28/19 Download- compliance        100%, AHI 18.9/ hr Body weight today-279 lbs Covid vax- 3 Phizer Flu vax-today senior -----Pt is doing well Download became available after he had left.  He didn't report any concerns with breakthrough snoring or inadequate sleep.  Since he is comfortable, I will leave settings as they are for now, but consider increasing pressure range next visit. He says cardiology studies including stress test went well this year.  03/29/23- 72 yoM former smoker( 40 pkyrs) followed for OSA, complicated by   HTN, HLD, DM2, CKD 3, Aortic Stenosis, Hx Prostate Cancer, Hyperlipidemia, CPAP auto 5-15/ [facility]     Replacement machine ordered 10/28/19 Download- compliance   100%, AHI 9.4/hr      Body weight today-269 lbs Discussed the use of AI scribe software for clinical note transcription with the patient, who gave verbal consent to proceed.  History of Present Illness   The patient presents with non-healing skin lesions on his forearms, which he attributes to sun damage .  He has been managing the lesions with band-aids, but new lesions appear when the band-aids are removed.  In addition to the skin lesions, the patient uses a CPAP machine for sleep apnea. He reports liking his new mask, which only covers the nose and wraps around the sides of the face. However, he has been experiencing more breakthrough apnea than desired, with the machine spending most of its time between nine and thirteen centimeters of water pressure.  The  patient also mentions a heart murmur, which is being followed by a cardiologist. He has no concerns about his breathing.       Flu vax-had ROS-see HPI   + = positive Constitutional:    weight loss, night sweats, fevers, chills, fatigue, lassitude. HEENT:    headaches, difficulty swallowing, tooth/dental problems, sore throat,       sneezing, itching, ear ache, nasal congestion, post nasal drip, snoring CV:    chest pain, orthopnea, PND, swelling in lower extremities, anasarca,                                   dizziness, palpitations Resp:   shortness of breath with exertion or at rest.                productive cough,   non-productive cough, coughing up of blood.              change in color of mucus.  wheezing.   Skin:    rash or lesions. GI:  No-   heartburn, indigestion, abdominal pain, nausea, vomiting, diarrhea,                 change in bowel habits, loss of appetite GU: dysuria, change in color of urine, no urgency or frequency.   flank pain. MS:   joint pain, stiffness, decreased range of motion, back pain. Neuro-     nothing unusual Psych:  change in mood or affect.  depression or anxiety.  memory loss.  OBJ- Physical Exam General- Alert, Oriented, Affect-appropriate, Distress- none acute, + obese Skin- rash-none, lesions- none, excoriation- none Lymphadenopathy- none Head- atraumatic            Eyes- Gross vision intact, PERRLA, conjunctivae and secretions clear            Ears- Hearing, canals-normal            Nose- Clear, no-Septal dev, mucus, polyps, erosion, perforation             Throat- Mallampati II , mucosa clear , drainage- none, tonsils- atrophic, + teeth Neck- flexible , trachea midline, no stridor , thyroid nl, carotid no bruit Chest - symmetrical excursion , unlabored           Heart/CV- RRR ,  murmur + 2/6 AS, no gallop  , no rub, nl s1 s2                           - JVD- none , edema- none, stasis changes- none, varices- none           Lung- clear to  P&A, wheeze- none, cough- none , dullness-none, rub- none           Chest wall-  Abd-  Br/ Gen/ Rectal- Not done, not indicated Extrem- cyanosis- none, clubbing, none, atrophy- none, strength- nl Neuro- grossly intact to observation  Assessment and Plan    Skin Lesions Multiple skin lesions on the forearms, possibly due to sun damage. No further assessment or plan discussed. He may consider seeing a Dermatologist.  Obstructive Sleep Apnea Patient is using CPAP with a nasal mask. Machine is currently set to range between 5 and 15 cmH2O, with most time spent between 9 and 13 cmH2O. Breakthrough apnea noted. -Increase CPAP pressure range to 5-20 cmH2O to reduce breakthrough apnea. -Order to be sent to [facility] for pressure adjustment.  Heart Murmur No change in status. Continues to be followed by cardiologist. No further plan discussed.

## 2023-03-29 ENCOUNTER — Encounter: Payer: Self-pay | Admitting: Internal Medicine

## 2023-03-29 ENCOUNTER — Ambulatory Visit: Payer: Medicare Other | Admitting: Internal Medicine

## 2023-03-29 VITALS — BP 128/58 | HR 62 | Temp 98.0°F | Ht 68.0 in | Wt 269.6 lb

## 2023-03-29 DIAGNOSIS — G4733 Obstructive sleep apnea (adult) (pediatric): Secondary | ICD-10-CM | POA: Diagnosis not present

## 2023-03-29 DIAGNOSIS — R011 Cardiac murmur, unspecified: Secondary | ICD-10-CM | POA: Diagnosis not present

## 2023-03-29 DIAGNOSIS — L989 Disorder of the skin and subcutaneous tissue, unspecified: Secondary | ICD-10-CM

## 2023-03-29 NOTE — Patient Instructions (Signed)
 Order- DME [facility]- change to autopap 5-20, continue mask of choice, humidifier, supplies, AirView/ card  Please call if we can help

## 2023-09-10 ENCOUNTER — Other Ambulatory Visit: Payer: Self-pay | Admitting: Cardiology

## 2023-09-10 DIAGNOSIS — E7800 Pure hypercholesterolemia, unspecified: Secondary | ICD-10-CM

## 2023-09-10 DIAGNOSIS — I10 Essential (primary) hypertension: Secondary | ICD-10-CM

## 2023-12-08 ENCOUNTER — Other Ambulatory Visit: Payer: Self-pay | Admitting: Cardiology

## 2023-12-08 DIAGNOSIS — I10 Essential (primary) hypertension: Secondary | ICD-10-CM

## 2023-12-24 ENCOUNTER — Other Ambulatory Visit: Payer: Self-pay | Admitting: Cardiology

## 2023-12-24 DIAGNOSIS — I10 Essential (primary) hypertension: Secondary | ICD-10-CM

## 2023-12-24 DIAGNOSIS — E1129 Type 2 diabetes mellitus with other diabetic kidney complication: Secondary | ICD-10-CM

## 2023-12-26 NOTE — Telephone Encounter (Signed)
 Pt of Dr. Ladona. Please advise on if Dr. Ladona wants to refill this RX.

## 2024-01-09 ENCOUNTER — Other Ambulatory Visit: Payer: Self-pay | Admitting: Cardiology

## 2024-01-09 DIAGNOSIS — I10 Essential (primary) hypertension: Secondary | ICD-10-CM

## 2024-01-22 ENCOUNTER — Other Ambulatory Visit: Payer: Self-pay | Admitting: Cardiology

## 2024-01-22 DIAGNOSIS — E1129 Type 2 diabetes mellitus with other diabetic kidney complication: Secondary | ICD-10-CM

## 2024-01-22 DIAGNOSIS — I10 Essential (primary) hypertension: Secondary | ICD-10-CM

## 2024-01-24 ENCOUNTER — Telehealth: Payer: Self-pay | Admitting: Cardiology

## 2024-01-24 DIAGNOSIS — E1129 Type 2 diabetes mellitus with other diabetic kidney complication: Secondary | ICD-10-CM

## 2024-01-24 DIAGNOSIS — I10 Essential (primary) hypertension: Secondary | ICD-10-CM

## 2024-01-24 MED ORDER — EMPAGLIFLOZIN 25 MG PO TABS: 25.0000 mg | ORAL_TABLET | Freq: Every day | ORAL | 0 refills | Status: DC

## 2024-01-24 MED ORDER — DILTIAZEM HCL ER COATED BEADS 240 MG PO CP24: 240.0000 mg | ORAL_CAPSULE | Freq: Every day | ORAL | 0 refills | Status: DC

## 2024-01-24 NOTE — Telephone Encounter (Signed)
 Pt scheduled to see Dr. Ladona 03/10/24.  Refills sent in until appointment.

## 2024-01-24 NOTE — Telephone Encounter (Signed)
*  STAT* If patient is at the pharmacy, call can be transferred to refill team.   1. Which medications need to be refilled? (please list name of each medication and dose if known) empagliflozin  (JARDIANCE ) 25 MG TABS tablet  diltiazem  (CARDIZEM  CD) 240 MG 24 hr capsule   2. Would you like to learn more about the convenience, safety, & potential cost savings by using the [facility]?      3. Are you open to using the [facility] (Type [facility].  ).   4. Which pharmacy/location (including street and city if local pharmacy) is medication to be sent to? [id number] - [place],  - [address] AT [facility] [address]    5. Do they need a 30 day or 90 day supply? 90 day

## 2024-01-25 ENCOUNTER — Other Ambulatory Visit: Payer: Self-pay | Admitting: Cardiology

## 2024-01-25 DIAGNOSIS — I10 Essential (primary) hypertension: Secondary | ICD-10-CM

## 2024-03-10 ENCOUNTER — Other Ambulatory Visit (HOSPITAL_COMMUNITY): Payer: Self-pay

## 2024-03-10 ENCOUNTER — Encounter: Payer: Self-pay | Admitting: Cardiology

## 2024-03-10 ENCOUNTER — Ambulatory Visit: Admitting: Cardiology

## 2024-03-10 VITALS — BP 124/64 | HR 72 | Ht 68.0 in | Wt 255.0 lb

## 2024-03-10 DIAGNOSIS — R809 Proteinuria, unspecified: Secondary | ICD-10-CM | POA: Diagnosis not present

## 2024-03-10 DIAGNOSIS — E1129 Type 2 diabetes mellitus with other diabetic kidney complication: Secondary | ICD-10-CM | POA: Insufficient documentation

## 2024-03-10 DIAGNOSIS — I350 Nonrheumatic aortic (valve) stenosis: Secondary | ICD-10-CM | POA: Diagnosis not present

## 2024-03-10 DIAGNOSIS — E7800 Pure hypercholesterolemia, unspecified: Secondary | ICD-10-CM | POA: Insufficient documentation

## 2024-03-10 DIAGNOSIS — N1831 Chronic kidney disease, stage 3a: Secondary | ICD-10-CM | POA: Diagnosis not present

## 2024-03-10 DIAGNOSIS — R0609 Other forms of dyspnea: Secondary | ICD-10-CM | POA: Insufficient documentation

## 2024-03-10 DIAGNOSIS — E1122 Type 2 diabetes mellitus with diabetic chronic kidney disease: Secondary | ICD-10-CM | POA: Insufficient documentation

## 2024-03-10 DIAGNOSIS — I10 Essential (primary) hypertension: Secondary | ICD-10-CM | POA: Diagnosis not present

## 2024-03-10 MED ORDER — CHLORTHALIDONE 25 MG PO TABS
25.0000 mg | ORAL_TABLET | ORAL | 3 refills | Status: AC
  Filled 2024-03-10: qty 90, 90d supply, fill #0

## 2024-03-10 MED ORDER — ROSUVASTATIN CALCIUM 20 MG PO TABS
20.0000 mg | ORAL_TABLET | Freq: Every day | ORAL | 3 refills | Status: AC
  Filled 2024-03-10: qty 90, 90d supply, fill #0

## 2024-03-10 MED ORDER — EMPAGLIFLOZIN 25 MG PO TABS
25.0000 mg | ORAL_TABLET | Freq: Every day | ORAL | 3 refills | Status: AC
  Filled 2024-03-10: qty 90, 90d supply, fill #0

## 2024-03-10 MED ORDER — DILTIAZEM HCL ER COATED BEADS 240 MG PO CP24
240.0000 mg | ORAL_CAPSULE | Freq: Every day | ORAL | 3 refills | Status: AC
  Filled 2024-03-10: qty 90, 90d supply, fill #0

## 2024-03-10 NOTE — Progress Notes (Signed)
 " Cardiology Office Note:  .   Date:  03/10/2024  ID:  Eddie Carpenter, DOB [birth year], MRN [id number] PCP: Debrah Josette MOHR PA-C  [facility] Providers Cardiologist:  Gordy Bergamo, MD   History of Present Illness: .   Eddie Carpenter is a 74 y.o. with HTN, HLD, DM with stage IIIa chronic kidney disease, moderate aortic stenosis, mild bilateral carotid atherosclerosis without high-grade stenosis, OSA on CPAP presents for annual visit, he has chronic dyspnea on exertion.    He has had a nonischemic stress EKG on 11/24/2021 and an echocardiogram revealing moderate LVH with normal LVEF and mild to moderate aortic valve stenosis on 10/05/2021. He uses CPAP for sleep apnea and has no significant worsening of shortness of breath, no leg swelling.    Discussed the use of AI scribe software for clinical note transcription with the patient, who gave verbal consent to proceed.  History of Present Illness Eddie Carpenter is a 74 year old male with diabetes, hypertension, and chronic kidney disease who presents for a cardiovascular follow-up.  He manages diabetes with Januvia, Actos, glipizide, and Jardiance  and is exploring Ozempic but has not started it. He has had difficulty affording Jardiance  and Januvia and has used assistance programs. For hypertension he takes chlorthalidone  25 mg daily and diltiazem  CD 240 mg daily. Lisinopril  was stopped for elevated potassium. He uses CPAP for sleep apnea and has no significant shortness of breath or leg swelling. He takes rosuvastatin  20 mg daily. He has stage 3A chronic kidney disease and follows with nephrology, though he is unsure of specific interventions.  Cardiac Studies relevent.    Cardiac Studies & Procedures   ______________________________________________________________________________________________ MYOCARDIAL PERFUSION WO LEXISCAN  11/24/2021 Exercise nuclear stress test was performed using Bruce protocol. Patient reached 5.9 METS,  and 97% of age predicted maximum heart rate. Exercise capacity was low. No chest pain reported, dyspnea reported. Heart rate and hemodynamic response were normal. Stress EKG revealed no ischemic changes. SPECT images show small sized, mild intensity, reversible perfusion defect in apical anteroseptal myocardium. Normal wall motion and myocardial thickening. Stress LVEF 73%. Low risk study.   Echocardiogram 10/05/2021: 1. Normal LV systolic function with visual EF 55-60%. Left ventricle cavity is normal in size. Moderate concentric hypertrophy of the left ventricle. Normal global wall motion. Doppler evidence of grade I (impaired) diastolic dysfunction, elevated LAP. 2. Trileaflet aortic valve with no regurgitation. Mild aortic valve leaflet thickening with moderate calcification. Moderate aortic valve stenosis. AVA (VTI) measures 1.3 cm^2. AV Mean Grad measures 23.5 mmHg. AV Pk Vel measures 3.14 m/s. 3. No mitral valve regurgitation. Mild mitral valve leaflet calcification. E-wave dominant mitral inflow. 4. Structurally normal tricuspid valve with no regurgitation. No evidence of pulmonary hypertension.  ______________________________________________________________________________________________    EKG:   EKG Interpretation Date/Time:  Monday March 10 2024 13:18:45 EST Ventricular Rate:  72 PR Interval:  240 QRS Duration:  98 QT Interval:  382 QTC Calculation: 418 R Axis:   1  Text Interpretation: EKG 03/10/2024: Sinus rhythm with first-degree AV block at rate of 72 bpm, left atrial enlargement.  Compared to 04/13/2010, first-degree AV block new. Confirmed by Shareece Bultman, Jagadeesh [phone]) on 03/10/2024 1:34:51 PM  Labs   Care everywhere/Faxed External Labs:  Labs 02/18/2024:  Total cholesterol 114, triglycerides 140, HDL 36, LDL 56.  A1c 8.8%.  Vitamin D25.5.  Sodium 138, potassium 4.3, BUN 26, creatinine 1.46, eGFR 51 mL.  Hb 15.4/HCT 45.9, platelets 162, normal indicis.  ROS   Review of  Systems  Cardiovascular:  Positive for dyspnea on exertion (stable). Negative for chest pain, leg swelling, near-syncope and palpitations.   Physical Exam:   VS:  BP 124/64 (BP Location: Left Arm, Patient Position: Sitting, Cuff Size: Large)   Pulse 72   Ht 5' 8 (1.727 m)   Wt 255 lb (115.7 kg)   SpO2 97%   BMI 38.77 kg/m    Wt Readings from Last 3 Encounters:  03/10/24 255 lb (115.7 kg)  03/29/23 269 lb 9.6 oz (122.3 kg)  11/02/22 275 lb (124.7 kg)    BP Readings from Last 3 Encounters:  03/10/24 124/64  03/29/23 (!) 128/58  11/02/22 (!) 140/77   Physical Exam Neck:     Vascular: No JVD.  Cardiovascular:     Rate and Rhythm: Normal rate and regular rhythm.     Pulses: Intact distal pulses.     Heart sounds: S1 normal and S2 normal. Murmur heard.     Midsystolic murmur is present with a grade of 3/6 at the upper right sternal border radiating to the neck.     No gallop.  Pulmonary:     Effort: Pulmonary effort is normal.     Breath sounds: Normal breath sounds.  Abdominal:     General: Bowel sounds are normal.     Palpations: Abdomen is soft.  Musculoskeletal:     Right lower leg: No edema.     Left lower leg: No edema.    ASSESSMENT AND PLAN: .      ICD-10-CM   1. Dyspnea on exertion  R06.09 EKG 12-Lead    ECHOCARDIOGRAM COMPLETE    2. Nonrheumatic aortic valve stenosis  I35.0 ECHOCARDIOGRAM COMPLETE    3. Pure hypercholesterolemia  E78.00 rosuvastatin  (CRESTOR ) 20 MG tablet    4. Type 2 diabetes mellitus with stage 3a chronic kidney disease, without long-term current use of insulin (HCC)  E11.22 empagliflozin  (JARDIANCE ) 25 MG TABS tablet   N18.31     5. Primary hypertension  I10 diltiazem  (CARDIZEM  CD) 240 MG 24 hr capsule    chlorthalidone  (HYGROTON ) 25 MG tablet    6. Type 2 diabetes mellitus with microalbuminuria, without long-term current use of insulin (HCC)  E11.29    R80.9     7. Hypercholesteremia  E78.00      Assessment &  Plan Nonrheumatic aortic valve stenosis No current symptoms or concerns related to aortic valve stenosis. - Ordered echocardiogram to follow up on aortic valve stenosis.  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 Stage 3a chronic kidney disease is well-managed with an eGFR of 51 mL/min. Discussed potential addition of a GLP-1 agonist (Wegovy or Ozempic) for better glycemic control and cardiovascular benefits. Emphasized the importance of slow eating and pausing during meals to prevent nausea associated with GLP-1 agonists. - Consider GLP-1 agonist (Wegovy or Ozempic) for diabetes management. - Continue current diabetes medications: Januvia, Actos, glipizide, and Jardiance .  Primary hypertension Hypertension is managed with chlorthalidone , diltiazem , and previously lisinopril , which was discontinued due to elevated potassium levels. - Continue chlorthalidone  and diltiazem  for blood pressure management.  Pure hypercholesterolemia Cholesterol levels are well-controlled with rosuvastatin . LDL is at 56 mg/dL, indicating effective management. - Continue rosuvastatin  for cholesterol management.  Follow up: 1 Year for aortic stenosis  Signed,  Gordy Bergamo, MD, [facility] 03/10/2024, 6:31 PM [facility] [address] [place], [place] [address] Phone: [phone]. Fax:  [phone]  "

## 2024-03-10 NOTE — Patient Instructions (Addendum)
 Medication Instructions:  START   chlorthalidone  (HYGROTON ) 25 MG tablet         Take 1 tablet (25 mg total) by mouth every morning.,      diltiazem  (CARDIZEM  CD) 240 MG 24 hr capsule        Take 1 capsule (240 mg total) by mouth daily.     empagliflozin  (JARDIANCE ) 25 MG TABS tablet        Take 1 tablet (25 mg total) by mouth daily     rosuvastatin  (CRESTOR ) 20 MG tablet        Take 1 tablet (20 mg total) by mouth daily   *If you need a refill on your cardiac medications before your next appointment, please call your pharmacy*  Lab Work: Lab Orders  No laboratory test(s) ordered today    If you have labs (blood work) drawn today and your tests are completely normal, you will receive your results only by: MyChart Message (if you have MyChart) OR A paper copy in the mail If you have any lab test that is abnormal or we need to change your treatment, we will call you to review the results.  Testing/Procedures: ECHOCARDIOGRAM   Your physician has requested that you have an echocardiogram. Echocardiography is a painless test that uses sound waves to create images of your heart. It provides your doctor with information about the size and shape of your heart and how well your hearts chambers and valves are working. This procedure takes approximately one hour. There are no restrictions for this procedure. Please do NOT wear cologne, perfume, aftershave, or lotions (deodorant is allowed). Please arrive 15 minutes prior to your appointment time.  Please note: We ask at that you not bring children with you during ultrasound (echo/ vascular) testing. Due to room size and safety concerns, children are not allowed in the ultrasound rooms during exams. Our front office staff cannot provide observation of children in our lobby area while testing is being conducted. An adult accompanying a patient to their appointment will only be allowed in the ultrasound room at the discretion of the ultrasound  technician under special circumstances. We apologize for any inconvenience.   Follow-Up: At [facility], you and your health needs are our priority.  As part of our continuing mission to provide you with exceptional heart care, our providers are all part of one team.  This team includes your primary Cardiologist (physician) and Advanced Practice Providers or APPs (Physician Assistants and Nurse Practitioners) who all work together to provide you with the care you need, when you need it.  Your next appointment:   1 year(s)  Provider:   Gordy Bergamo, MD            We recommend signing up for the patient portal called MyChart.  Patients are able to view lab/test results, encounter notes, upcoming appointments, etc.  Non-urgent messages can be sent to your provider as well, go to [email or web address].

## 2024-03-12 ENCOUNTER — Other Ambulatory Visit (HOSPITAL_COMMUNITY): Payer: Self-pay

## 2024-03-18 ENCOUNTER — Other Ambulatory Visit (HOSPITAL_COMMUNITY): Payer: Self-pay

## 2024-03-27 ENCOUNTER — Ambulatory Visit: Payer: Self-pay

## 2024-03-27 ENCOUNTER — Encounter

## 2024-03-27 NOTE — Telephone Encounter (Signed)
 Noted. Pt scheduled with Dr Theodoro on 05-21-24. NFN

## 2024-03-27 NOTE — Telephone Encounter (Signed)
 Pt states that he is calling to reschedule his Pulm TOC. Pt states that he cannot drive today d/t fall last week and pain. Pt states that he has already spoke to doctor about the pain and is getting imaging for the pain. Pt rescheduled.   Reason for Triage: Pt fell last week. Pt wanted to reschedule his appt with Dr. Theodoro due to falling last week. Pain in his shoulders.

## 2024-03-28 ENCOUNTER — Ambulatory Visit: Payer: Medicare Other | Admitting: Internal Medicine

## 2024-04-09 ENCOUNTER — Ambulatory Visit (HOSPITAL_COMMUNITY)

## 2024-05-21 ENCOUNTER — Encounter
